# Patient Record
Sex: Male | Born: 1935
Health system: Southern US, Community
[De-identification: ages and names within clinical notes are randomized; demographics above are authoritative.]

## PROBLEM LIST (undated history)

## (undated) DIAGNOSIS — G309 Alzheimer's disease, unspecified: Secondary | ICD-10-CM

## (undated) DIAGNOSIS — T4145XA Adverse effect of unspecified anesthetic, initial encounter: Secondary | ICD-10-CM

## (undated) DIAGNOSIS — Z87442 Personal history of urinary calculi: Secondary | ICD-10-CM

## (undated) DIAGNOSIS — E039 Hypothyroidism, unspecified: Secondary | ICD-10-CM

## (undated) DIAGNOSIS — I4891 Unspecified atrial fibrillation: Secondary | ICD-10-CM

## (undated) DIAGNOSIS — M1990 Unspecified osteoarthritis, unspecified site: Secondary | ICD-10-CM

## (undated) DIAGNOSIS — E785 Hyperlipidemia, unspecified: Secondary | ICD-10-CM

## (undated) DIAGNOSIS — Z0389 Encounter for observation for other suspected diseases and conditions ruled out: Secondary | ICD-10-CM

## (undated) DIAGNOSIS — I10 Essential (primary) hypertension: Secondary | ICD-10-CM

## (undated) DIAGNOSIS — R351 Nocturia: Secondary | ICD-10-CM

## (undated) DIAGNOSIS — F0280 Dementia in other diseases classified elsewhere without behavioral disturbance: Secondary | ICD-10-CM

## (undated) DIAGNOSIS — J302 Other seasonal allergic rhinitis: Secondary | ICD-10-CM

## (undated) DIAGNOSIS — T8859XA Other complications of anesthesia, initial encounter: Secondary | ICD-10-CM

## (undated) DIAGNOSIS — R441 Visual hallucinations: Secondary | ICD-10-CM

## (undated) DIAGNOSIS — R39198 Other difficulties with micturition: Secondary | ICD-10-CM

## (undated) HISTORY — DX: Visual hallucinations: R44.1

## (undated) HISTORY — DX: Dementia in other diseases classified elsewhere without behavioral disturbance: F02.80

## (undated) HISTORY — PX: TONSILLECTOMY: SUR1361

## (undated) HISTORY — DX: Alzheimer's disease, unspecified: G30.9

## (undated) HISTORY — PX: CATARACT EXTRACTION: SUR2

---

## 1998-01-24 ENCOUNTER — Other Ambulatory Visit: Admission: RE | Admit: 1998-01-24 | Discharge: 1998-01-24 | Payer: Self-pay | Admitting: Family Medicine

## 1998-07-08 DIAGNOSIS — IMO0001 Reserved for inherently not codable concepts without codable children: Secondary | ICD-10-CM

## 1998-07-08 HISTORY — PX: CORONARY ANGIOGRAPHY: CATH118303

## 1998-07-08 HISTORY — DX: Reserved for inherently not codable concepts without codable children: IMO0001

## 1999-01-08 ENCOUNTER — Encounter: Payer: Self-pay | Admitting: Family Medicine

## 1999-01-09 ENCOUNTER — Encounter: Payer: Self-pay | Admitting: Diagnostic Radiology

## 1999-01-09 ENCOUNTER — Inpatient Hospital Stay (HOSPITAL_COMMUNITY): Admission: EM | Admit: 1999-01-09 | Discharge: 1999-01-12 | Payer: Self-pay | Admitting: Emergency Medicine

## 1999-01-11 ENCOUNTER — Encounter: Payer: Self-pay | Admitting: Urology

## 1999-01-11 ENCOUNTER — Encounter: Payer: Self-pay | Admitting: Cardiovascular Disease

## 1999-01-23 ENCOUNTER — Ambulatory Visit (HOSPITAL_COMMUNITY): Admission: RE | Admit: 1999-01-23 | Discharge: 1999-01-23 | Payer: Self-pay | Admitting: Cardiovascular Disease

## 2001-02-06 ENCOUNTER — Encounter: Payer: Self-pay | Admitting: Family Medicine

## 2001-02-06 ENCOUNTER — Encounter: Admission: RE | Admit: 2001-02-06 | Discharge: 2001-02-06 | Payer: Self-pay | Admitting: Family Medicine

## 2004-07-08 HISTORY — PX: CYSTOSCOPY: SUR368

## 2005-03-17 ENCOUNTER — Observation Stay (HOSPITAL_COMMUNITY): Admission: EM | Admit: 2005-03-17 | Discharge: 2005-03-18 | Payer: Self-pay | Admitting: Family Medicine

## 2006-12-19 ENCOUNTER — Encounter: Admission: RE | Admit: 2006-12-19 | Discharge: 2006-12-19 | Payer: Self-pay | Admitting: Family Medicine

## 2007-05-04 ENCOUNTER — Ambulatory Visit: Payer: Self-pay | Admitting: Gastroenterology

## 2007-05-14 ENCOUNTER — Encounter: Payer: Self-pay | Admitting: Gastroenterology

## 2007-05-14 ENCOUNTER — Ambulatory Visit: Payer: Self-pay | Admitting: Gastroenterology

## 2007-09-03 DIAGNOSIS — J309 Allergic rhinitis, unspecified: Secondary | ICD-10-CM | POA: Insufficient documentation

## 2007-09-03 DIAGNOSIS — I10 Essential (primary) hypertension: Secondary | ICD-10-CM

## 2007-09-03 DIAGNOSIS — E785 Hyperlipidemia, unspecified: Secondary | ICD-10-CM | POA: Insufficient documentation

## 2007-09-03 DIAGNOSIS — D126 Benign neoplasm of colon, unspecified: Secondary | ICD-10-CM

## 2007-09-03 DIAGNOSIS — K5730 Diverticulosis of large intestine without perforation or abscess without bleeding: Secondary | ICD-10-CM | POA: Insufficient documentation

## 2007-09-03 DIAGNOSIS — N200 Calculus of kidney: Secondary | ICD-10-CM | POA: Insufficient documentation

## 2007-09-03 DIAGNOSIS — M129 Arthropathy, unspecified: Secondary | ICD-10-CM | POA: Insufficient documentation

## 2010-11-20 NOTE — Assessment & Plan Note (Signed)
[facility]                         GASTROENTEROLOGY OFFICE NOTE   BLAKELEY, MARGRAF                      MRN:          [id number]  DATE:05/04/2007                            DOB:          1922-09-29    REASON FOR CONSULT:  Melena.   HISTORY OF PRESENT ILLNESS:  This is an 77 year old white male who  relates the onset of diarrhea in early September.  The diarrhea resolved  spontaneously and following that, he had 4 days of black and tarry  stools.  Since then, his symptoms have resolved.  He was evaluated by  Dr. Artis Flock and apparently home stool hemoccults were negative.  A recent  CBC, CMET, TSH  and urinalysis were unremarkable.  He relates no weight  loss, abdominal pain, rectal pain, change in bowel habits or change in  stool caliber except for the self-limited diarrhea that occurred  approximately 6 weeks ago.  There is no family history of colon cancer,  colon polyps or inflammatory bowel disease.  He states he had a  sigmoidoscopy performed about 10 years ago that was normal.   PAST MEDICAL HISTORY:  1. Hypertension.  2. Hyperlipidemia.  3. Arthritis.  4. Allergic rhinitis.  5. Kidney stones.   CURRENT MEDICATIONS:  1. Hyzaar 50/12.5 one daily.  2. Zocor 20 mg daily.  3. Allegra 180 mg daily.   MEDICATION ALLERGIES:  None known.   SOCIAL HISTORY AND REVIEW OF SYSTEMS:  Per the handwritten form.   PHYSICAL EXAMINATION:  Well-developed, well-nourished, no acute  distress.  Height 5 feet 8 inches, weight 197.4 pounds.  Blood pressure  is 142/82, pulse 82 and regular.  HEENT:  Anicteric sclerae, oropharynx clear.  CHEST:  Clear to auscultation bilaterally.  CARDIAC:  Regular rate and rhythm without murmurs appreciated.  ABDOMEN:  Soft, nontender, nondistended, normal active bowel sounds  without palpable organomegaly, masses or hernias.  RECTAL:  Deferred to time of colonoscopy.  Recent stool hemoccults  negative.  EXTREMITIES:   Without clubbing, cyanosis, or edema.  NEUROLOGIC:  Alert and oriented x3.  Grossly nonfocal.   ASSESSMENT AND PLAN:  History of melena.  Rule out colorectal neoplasms  and other sources of colonic blood loss.  Risks, benefits and  alternatives to colonoscopy with possible biopsy and possible  polypectomy discussed with the patient and he consents to proceed.     Venita Lick. Russella Dar, MD, [facility]  Electronically Signed    MTS/MedQ  DD: 05/11/2007  DT: 05/12/2007  Job #: [id number]   cc:   Quita Skye. Artis Flock, M.D.

## 2010-11-23 NOTE — Op Note (Signed)
[name] [name], [name] [name]               ACCOUNT NO.:  [id number]   MEDICAL RECORD NO.:  [id number]          PATIENT TYPE:  INP   LOCATION:  0103                         FACILITY:  [facility]   PHYSICIAN:  Excell Seltzer. Annabell Howells, M.D.    DATE OF BIRTH:  December 12, 1922   DATE OF PROCEDURE:  03/17/2005  DATE OF DISCHARGE:                                 OPERATIVE REPORT   PROCEDURES:  1.  Cystoscopy.  2.  Right retrograde pyelogram with interpretation.  3.  Right ureteroscopic stone extraction.  4.  Insertion of right double-J stent.   PREOPERATIVE DIAGNOSIS:  Right ureteral stone with fever.   POSTOPERATIVE DIAGNOSIS:  Right ureteral stone with fever.   SURGEON:  Excell Seltzer. Annabell Howells, M.D.   ANESTHESIA:  General.   DRAIN:  A 6-French x 24 cm double-J stent.   SPECIMENS:  None.   COMPLICATIONS:  None.   INDICATIONS:  [name] [name] is a 77 year old white male who has a 4-mm right  distal ureteral stone and is being observed but developed a fever over the  weekend 102. He was seen in the ER last night where his temperature was  101.1. His white count was mildly elevated at 13.8 and a CT scan revealed a  right distal ureteral stone with hydronephrosis and some perinephric  stranding. It was felt that the cysto and a stent with possible ureteroscopy  was indicated. He was given Cipro at 5:00 a.m. The procedure was done at 8  o'clock.   FINDINGS AND PROCEDURE:  The patient was taken to the operating room where  general anesthetic was induced as noted. He had been given Cipro  preoperatively. He was placed in the lithotomy position. His perineum and  genitalia were prepped with Betadine solution. He was draped in the usual  sterile fashion. Cystoscopy was performed in the usual fashion with a 22-  [place] scope. The 12 and 70 degrees lenses were used for evaluation.  Examination revealed a normal urethra. The external sphincter was intact.  The prostatic urethra was short with mild bilobar hyperplasia without  significant obstruction. Examination of the bladder revealed the bladder  wall had mild to moderate trabeculation without tumor, stones or  inflammation. The ureteral orifices were unremarkable. Of note, his  preoperative urine was free of nitrites and really only 1-3 white cells so  the urine in the bladder was clear.   A 5-French open-ended catheter was placed in the right ureteral orifice.  Contrast was instilled. This revealed some J hooking of the distal ureter.  There was a filling defect approximately 3 cm up consistent with a stone and  very mild dilation proximal to this.   A guidewire was then passed up the right ureteral orifice to the kidney and  since the patient had been on antibiotics, his urine was clear in the ER. I  elected to go ahead and attempt ureteroscopy with the intent that if it were  at all difficult, I would just put up a stent. A 12-French dilator sheath  was passed up to the level of the stone over the wire and a  6-French short  ureteroscope was then advanced alongside the wire. The stone was readily  visualized, grasped with a nitinol basket, and removed without difficulty.   The cystoscope was then re-inserted over the wire and a 6-French 24-cm  double-J stent was inserted under fluoroscopic guidance to the kidney. Upon  removal of the wire, there was a good coil noted in the kidney and in the  bladder. The bladder was drained. The scope was removed. The patient was  taken down from lithotomy position. His anesthetic was reversed. He was  moved to the recovery room in stable condition. There were no complications.      Excell Seltzer. Annabell Howells, M.D.  Electronically Signed     JJW/MEDQ  D:  03/17/2005  T:  03/18/2005  Job:  [id number]   cc:   Bertram Millard. Dahlstedt, M.D.  Fax: [phone]

## 2011-01-03 ENCOUNTER — Other Ambulatory Visit: Payer: Self-pay | Admitting: Dermatology

## 2011-04-23 ENCOUNTER — Ambulatory Visit (HOSPITAL_BASED_OUTPATIENT_CLINIC_OR_DEPARTMENT_OTHER)
Admission: RE | Admit: 2011-04-23 | Discharge: 2011-04-23 | Disposition: A | Discharge status: Home / Self Care | Payer: Medicare Other | Source: Ambulatory Visit | Attending: Orthopedic Surgery | Admitting: Orthopedic Surgery

## 2011-04-23 DIAGNOSIS — L608 Other nail disorders: Secondary | ICD-10-CM | POA: Insufficient documentation

## 2011-04-23 DIAGNOSIS — M19049 Primary osteoarthritis, unspecified hand: Secondary | ICD-10-CM | POA: Insufficient documentation

## 2011-04-23 DIAGNOSIS — M6740 Ganglion, unspecified site: Secondary | ICD-10-CM | POA: Insufficient documentation

## 2011-04-24 NOTE — Op Note (Signed)
[name], [name]               ACCOUNT NO.:  [id number]  MEDICAL RECORD NO.:  [id number]  LOCATION:                                 FACILITY:  PHYSICIAN:  Katy Fitch. Brandyce Dimario, M.D.      DATE OF BIRTH:  DATE OF PROCEDURE:  04/23/2011 DATE OF DISCHARGE:                              OPERATIVE REPORT   PREOPERATIVE DIAGNOSES: 1. Advanced osteoarthritis, left long finger distal interphalangeal     joint. 2. Mucoid cyst, dorsal radial nail fold. 3. Nail groove due to chronic pressure on germinal nail matrix.  POSTOPERATIVE DIAGNOSES: 1. Advanced osteoarthritis, left long finger distal interphalangeal     joint. 2. Mucoid cyst, dorsal radial nail fold. 3. Nail groove due to chronic pressure on germinal nail matrix.  OPERATION: 1. Resection of dorsal nail fold mucoid cyst. 2. Left long finger distal interphalangeal joint arthrotomy with     removal of marginal osteophytes, radial base of distal phalanx,     synovectomy, loose body removal, and irrigation of joint.  OPERATING SURGEON:  Katy Fitch. Harjas Biggins, MD  ASSISTANT:  Marveen Reeks. Dasnoit, PA-C.  ANESTHESIA:  Lidocaine 2%, metacarpal head level block of left long finger.  This was a minor operating room procedure.  INDICATIONS:  [name] [name] is a 77 year old gentleman referred through the courtesy of Dr. Guerry Bruin for evaluation and management of a mucoid cyst that has been chronic for more than 2 years.  He has a full length nail groove.  He has had multiple episodes of drainage. Fortunately, no episodes of septic arthritis.  His wife had a previous procedure performed more than 10 years ago and was very pleased with the long-term result.  Mr. Rohrman presented for evaluation of his finger.  We advised that we could prevent the deformity of his nail plate by decompressing the nail fold and with debridement of the joint, irrigation of the joint, synovectomy, and removal loose bodies, we had a good chance  of correcting the mucoid cyst.  With his degree of advanced osteoarthritis, we will not affect his pain from arthritis and cannot guarantee that he will not develop more mucoid cysts over time.  Fully understands these issues, he was brought to the operating room at this time.  PROCEDURE:  [name] [name] was brought to room #2 of the [facility] and placed in the supine position on the operating table. Following detailed informed consent, the left long finger was anesthetized after prep of the skin with alcohol, Betadine with 2% lidocaine into the region of the common digital nerves at metacarpal head level, 4.5 mL of 2% lidocaine was utilized.  The hand and arm were then prepped with Betadine soap solution and sterilely draped.  After a routine surgical time-out, the left long finger was exsanguinated with a gauze wrap and a half inch Penrose drain placed at the base of the finger as a digital tourniquet.  Procedure commenced with a curvilinear incision incorporating the cyst distally.  The cyst was circumferentially dissected, all mucin removed, the germinal nail matrix identified and decompressed.  The DIP joint was then opened proximally with a longitudinal incision, resection of the capsule between the radial collateral  ligament and extensor tendon.  A microcurette was used to remove the base osteophyte followed by synovectomy of the joint utilizing a micro rongeur and micro curette. The joint was thoroughly irrigated with sterile saline using a 19-gauge blunt dental needle with a 10 mL syringe repeatedly irrigating the joint until all loose bodies were debrided and removed.  Thereafter hemostasis was achieved.  The wound was repaired with trauma sutures of 5-0 nylon. Mr. Bovenzi was placed in a compressive dressing with Xeroflo sterile gauze and a finger Coban dressing.  He was encouraged to elevate his hand for 48 hours.  For aftercare, he is provided prescriptions  for Keflex 500 mg 1 p.o. q.8 hours x4 days as prophylactic antibiotic.  He is also provided Tylenol with Codeine #3, 1 p.o. q.4-6 hours p.r.n. pain, 20 tablets without refill.     Katy Fitch Idora Brosious, M.D.     RVS/MEDQ  D:  04/23/2011  T:  04/23/2011  Job:  [id number]  Electronically Signed by Josephine Igo M.D. on 04/24/2011 07:56:23 AM

## 2011-11-11 ENCOUNTER — Encounter (HOSPITAL_COMMUNITY): Payer: Self-pay | Admitting: Pharmacy Technician

## 2011-11-12 ENCOUNTER — Encounter (HOSPITAL_COMMUNITY): Payer: Self-pay

## 2011-11-12 ENCOUNTER — Encounter (HOSPITAL_COMMUNITY)
Admission: RE | Admit: 2011-11-12 | Discharge: 2011-11-12 | Disposition: A | Discharge status: Home / Self Care | Payer: Medicare Other | Source: Ambulatory Visit | Attending: Orthopedic Surgery | Admitting: Orthopedic Surgery

## 2011-11-12 HISTORY — DX: Other difficulties with micturition: R39.198

## 2011-11-12 HISTORY — DX: Hypothyroidism, unspecified: E03.9

## 2011-11-12 HISTORY — DX: Nocturia: R35.1

## 2011-11-12 HISTORY — DX: Unspecified osteoarthritis, unspecified site: M19.90

## 2011-11-12 HISTORY — DX: Hyperlipidemia, unspecified: E78.5

## 2011-11-12 HISTORY — DX: Essential (primary) hypertension: I10

## 2011-11-12 HISTORY — DX: Other complications of anesthesia, initial encounter: T88.59XA

## 2011-11-12 HISTORY — DX: Personal history of urinary calculi: Z87.442

## 2011-11-12 HISTORY — DX: Adverse effect of unspecified anesthetic, initial encounter: T41.45XA

## 2011-11-12 LAB — DIFFERENTIAL
Basophils Absolute: 0 10*3/uL (ref 0.0–0.1)
Basophils Relative: 0 % (ref 0–1)
Eosinophils Relative: 1 % (ref 0–5)
Monocytes Absolute: 0.6 10*3/uL (ref 0.1–1.0)
Monocytes Relative: 9 % (ref 3–12)
Neutro Abs: 4.3 10*3/uL (ref 1.7–7.7)

## 2011-11-12 LAB — URINALYSIS, ROUTINE W REFLEX MICROSCOPIC
Glucose, UA: NEGATIVE mg/dL
Hgb urine dipstick: NEGATIVE
Leukocytes, UA: NEGATIVE
Protein, ur: NEGATIVE mg/dL
pH: 5.5 (ref 5.0–8.0)

## 2011-11-12 LAB — BASIC METABOLIC PANEL
BUN: 19 mg/dL (ref 6–23)
CO2: 26 mEq/L (ref 19–32)
Chloride: 104 mEq/L (ref 96–112)
Creatinine, Ser: 1.04 mg/dL (ref 0.50–1.35)
GFR calc Af Amer: 73 mL/min — ABNORMAL LOW (ref >90)
Glucose, Bld: 113 mg/dL — ABNORMAL HIGH (ref 70–99)
Potassium: 3.6 mEq/L (ref 3.5–5.1)

## 2011-11-12 LAB — CBC
HCT: 39.3 % (ref 39.0–52.0)
Hemoglobin: 13.2 g/dL (ref 13.0–17.0)
MCH: 31.7 pg (ref 26.0–34.0)
MCHC: 33.6 g/dL (ref 30.0–36.0)
MCV: 94.5 fL (ref 78.0–100.0)
RDW: 13.3 % (ref 11.5–15.5)

## 2011-11-12 LAB — SURGICAL PCR SCREEN: MRSA, PCR: NEGATIVE

## 2011-11-12 MED ORDER — CHLORHEXIDINE GLUCONATE 4 % EX LIQD
60.0000 mL | Freq: Once | CUTANEOUS | Status: DC
  Filled 2011-11-12: qty 60

## 2011-11-12 NOTE — Patient Instructions (Signed)
20 [name] [name]  11/12/2011   Your procedure is scheduled on:  11/19/11  Report to SHORT STAY DEPT  at 5:15 AM.  Call this number if you have problems the morning of surgery: [phone]   Remember:   Do not eat food or drink liquids AFTER MIDNIGHT    Take these medicines the morning of surgery with A SIP OF WATER: SYNTHROID   Do not wear jewelry, make-up or nail polish.  Do not wear lotions, powders, or perfumes.   Do not shave legs or underarms 48 hrs. before surgery (men may shave face)  Do not bring valuables to the hospital.  Contacts, dentures or bridgework may not be worn into surgery.  Leave suitcase in the car. After surgery it may be brought to your room.  For patients admitted to the hospital, checkout time is 11:00 AM the day of discharge.   Patients discharged the day of surgery will not be allowed to drive home.    Special Instructions:   Please read over the following fact sheets that you were given: MRSA  Information               SHOWER WITH BETASEPT THE NIGHT BEFORE SURGERY AND THE MORNING OF SURGERY

## 2011-11-13 NOTE — Progress Notes (Signed)
H&P performed 11/13/11 Dictation # [id number]

## 2011-11-13 NOTE — H&P (Signed)
[name] [name], [name] [name]               ACCOUNT NO.:  [id number]  MEDICAL RECORD NO.:  [id number]  LOCATION:  PADM                         FACILITY:  [facility]  PHYSICIAN:  Jaquelyn Bitter. Cooper Moroney, P.A.DATE OF BIRTH:  Nov 19, 1922  DATE OF ADMISSION:  11/12/2011 DATE OF DISCHARGE:  11/12/2011                             HISTORY & PHYSICAL   DATE OF SURGERY:  Nov 19, 2011.  ADMITTING DIAGNOSIS:  End-stage osteoarthritis, left knee.  HISTORY OF PRESENT ILLNESS:  This is an 78 year old gentleman with a history of end-stage osteoarthritis of his left knee that has failed conservative management.  After discussion of treatment, benefits, risks, and options, the patient is now scheduled for total knee arthroplasty of the left knee.  Note that his medical doctor is Dr. Guerry Bruin, his cardiologist is Dr. Elease Hashimoto.  He is planning on going home after his surgery.  He is a candidate for tranexamic acid and dexamethasone and will receive both at surgery.  He is given his home medicines of iron, aspirin, Robaxin, MiraLAX and Colace.  PAST MEDICAL HISTORY:  Drug allergies, none.  Medical illnesses include hypothyroidism, hypertension, hyperlipidemia, allergies.  He also has some BPH and he has some retina problems and has been advised not to take Flomax or those type of medicines for urinary retention if possible.  CURRENT MEDICATIONS: 1. Levothyroxine 75 mcg 1 daily, 2. Losartan hydrochlorothiazide 50/12.51 daily, 3. Simvastatin 20 mg 1 daily, 4. Fexofenadine 180 mg daily.  PREVIOUS SURGERIES:  Tonsillectomy.  FAMILY HISTORY:  Positive for intestinal blockage, kidney failure and cancer.  SOCIAL HISTORY:  The patient is married.  He is retired.  He does not smoke and does not drink and again plans on going home after surgery.  REVIEW OF SYSTEMS:  CENTRAL NERVOUS SYSTEM:  Positive for cataracts and retina difficulties.  Also history of shingles and impaired hearing. PULMONARY:  Negative  shortness of breath, PND, orthopnea. CARDIOVASCULAR:  Positive for hypertension.  Negative for chest pain or palpitation.  GI:  Negative for ulcers, hepatitis.  GU:  Positive for history of kidney stones and BPH.  MUSCULOSKELETAL:  Positive as in HPI.  PHYSICAL EXAMINATION:  VITAL SIGNS:  Blood pressure 138/79, pulse 72 and regular, respirations 14. HEENT:  Head normocephalic.  Nose patent.  Ears patent.  Pupils equal, round, and reactive to light.  Throat without injection. NECK:  Supple without adenopathy.  Carotids 2+ without bruit. CHEST:  Clear to auscultation.  No rales or rhonchi.  Respirations 14. HEART:  Regular rate and rhythm at 72 beats per minute without murmur. ABDOMEN:  Soft with active bowel sounds.  No masses or organomegaly. NEUROLOGIC:  The patient is alert and oriented to time, place, and person.  Cranial nerves II-XII grossly intact. EXTREMITIES:  Show varus deformity of both knees.  Left knee shows 3 degrees from full extension with further flexion to 110 degrees. Crepitation throughout the range of motion.  Neurovascular status intact.  ASSESSMENT:  End-stage osteoarthritis, left knee.  PLAN:  Total knee arthroplasty, left knee.     Jaquelyn Bitter. Ernestene Kiel.     SJC/MEDQ  D:  11/13/2011  T:  11/13/2011  Job:  [id number]

## 2011-11-18 NOTE — Anesthesia Preprocedure Evaluation (Signed)
Anesthesia Evaluation  Patient identified by MRN, date of birth, ID band Patient awake    Reviewed: Allergy & Precautions, H&P , NPO status , Patient's Chart, lab work & pertinent test results  Airway Mallampati: II TM Distance: <3 FB Neck ROM: Full    Dental No notable dental hx.    Pulmonary neg pulmonary ROS,  breath sounds clear to auscultation  Pulmonary exam normal       Cardiovascular hypertension, Pt. on medications negative cardio ROS  Rhythm:Regular Rate:Normal     Neuro/Psych negative neurological ROS  negative psych ROS   GI/Hepatic negative GI ROS, Neg liver ROS,   Endo/Other  negative endocrine ROSHypothyroidism   Renal/GU negative Renal ROS  negative genitourinary   Musculoskeletal negative musculoskeletal ROS (+)   Abdominal   Peds negative pediatric ROS (+)  Hematology negative hematology ROS (+)   Anesthesia Other Findings   Reproductive/Obstetrics negative OB ROS                           Anesthesia Physical Anesthesia Plan  ASA: II  Anesthesia Plan: Spinal   Post-op Pain Management:    Induction:   Airway Management Planned: Simple Face Mask  Additional Equipment:   Intra-op Plan:   Post-operative Plan:   Informed Consent: I have reviewed the patients History and Physical, chart, labs and discussed the procedure including the risks, benefits and alternatives for the proposed anesthesia with the patient or authorized representative who has indicated his/her understanding and acceptance.   Dental advisory given  Plan Discussed with: CRNA  Anesthesia Plan Comments:         Anesthesia Quick Evaluation

## 2011-11-18 NOTE — Pre-Procedure Instructions (Signed)
PT NOTIFIED OF SURGERY TIME CHANGE TO 9:00AM AND ARRIVAL TIME TO BE 6:30 AM

## 2011-11-19 ENCOUNTER — Ambulatory Visit (HOSPITAL_COMMUNITY): Payer: Medicare Other | Admitting: Anesthesiology

## 2011-11-19 ENCOUNTER — Encounter (HOSPITAL_COMMUNITY)
Admission: RE | Disposition: A | Discharge status: Home Health | Payer: Self-pay | Source: Ambulatory Visit | Attending: Orthopedic Surgery

## 2011-11-19 ENCOUNTER — Encounter (HOSPITAL_COMMUNITY): Payer: Self-pay | Admitting: Anesthesiology

## 2011-11-19 ENCOUNTER — Encounter (HOSPITAL_COMMUNITY): Payer: Self-pay | Admitting: *Deleted

## 2011-11-19 ENCOUNTER — Inpatient Hospital Stay (HOSPITAL_COMMUNITY)
Admission: RE | Admit: 2011-11-19 | Discharge: 2011-11-21 | DRG: 470 | Disposition: A | Discharge status: Home Health | Payer: Medicare Other | Source: Ambulatory Visit | Attending: Orthopedic Surgery | Admitting: Orthopedic Surgery

## 2011-11-19 DIAGNOSIS — I10 Essential (primary) hypertension: Secondary | ICD-10-CM | POA: Diagnosis present

## 2011-11-19 DIAGNOSIS — N400 Enlarged prostate without lower urinary tract symptoms: Secondary | ICD-10-CM | POA: Diagnosis present

## 2011-11-19 DIAGNOSIS — M1710 Unilateral primary osteoarthritis, unspecified knee: Principal | ICD-10-CM | POA: Diagnosis present

## 2011-11-19 DIAGNOSIS — Z96659 Presence of unspecified artificial knee joint: Secondary | ICD-10-CM

## 2011-11-19 DIAGNOSIS — E785 Hyperlipidemia, unspecified: Secondary | ICD-10-CM | POA: Diagnosis present

## 2011-11-19 DIAGNOSIS — E039 Hypothyroidism, unspecified: Secondary | ICD-10-CM | POA: Diagnosis present

## 2011-11-19 HISTORY — PX: TOTAL KNEE ARTHROPLASTY: SHX125

## 2011-11-19 LAB — TYPE AND SCREEN: ABO/RH(D): A POS

## 2011-11-19 LAB — ABO/RH: ABO/RH(D): A POS

## 2011-11-19 SURGERY — ARTHROPLASTY, KNEE, TOTAL
Anesthesia: Spinal | Site: Knee | Laterality: Left | Wound class: Clean

## 2011-11-19 MED ORDER — LOSARTAN POTASSIUM 50 MG PO TABS
50.0000 mg | ORAL_TABLET | Freq: Every day | ORAL | Status: DC
  Administered 2011-11-20 – 2011-11-21 (×2): 50 mg via ORAL
  Filled 2011-11-19 (×4): qty 1

## 2011-11-19 MED ORDER — ONDANSETRON HCL 4 MG/2ML IJ SOLN
INTRAMUSCULAR | Status: DC | PRN
  Administered 2011-11-19 (×2): 2 mg via INTRAVENOUS

## 2011-11-19 MED ORDER — BISACODYL 5 MG PO TBEC
5.0000 mg | DELAYED_RELEASE_TABLET | Freq: Every day | ORAL | Status: DC | PRN
  Filled 2011-11-19: qty 1

## 2011-11-19 MED ORDER — CEFAZOLIN SODIUM-DEXTROSE 2-3 GM-% IV SOLR
INTRAVENOUS | Status: AC
  Filled 2011-11-19: qty 50

## 2011-11-19 MED ORDER — TRANEXAMIC ACID 100 MG/ML IV SOLN
1210.0000 mg | Freq: Once | INTRAVENOUS | Status: AC
  Administered 2011-11-19: 1210 mg via INTRAVENOUS
  Filled 2011-11-19: qty 12.1

## 2011-11-19 MED ORDER — 0.9 % SODIUM CHLORIDE (POUR BTL) OPTIME
TOPICAL | Status: DC | PRN
  Administered 2011-11-19: 1000 mL

## 2011-11-19 MED ORDER — CEFAZOLIN SODIUM-DEXTROSE 2-3 GM-% IV SOLR
2.0000 g | Freq: Once | INTRAVENOUS | Status: AC
  Administered 2011-11-19: 2 g via INTRAVENOUS

## 2011-11-19 MED ORDER — DEXAMETHASONE SODIUM PHOSPHATE 10 MG/ML IJ SOLN
10.0000 mg | Freq: Once | INTRAMUSCULAR | Status: AC
  Administered 2011-11-19: 10 mg via INTRAVENOUS

## 2011-11-19 MED ORDER — LORATADINE 10 MG PO TABS
10.0000 mg | ORAL_TABLET | Freq: Every day | ORAL | Status: DC
  Administered 2011-11-20 – 2011-11-21 (×2): 10 mg via ORAL
  Filled 2011-11-19 (×4): qty 1

## 2011-11-19 MED ORDER — EPHEDRINE SULFATE 50 MG/ML IJ SOLN
INTRAMUSCULAR | Status: DC | PRN
  Administered 2011-11-19: 10 mg via INTRAVENOUS
  Administered 2011-11-19 (×2): 5 mg via INTRAVENOUS

## 2011-11-19 MED ORDER — SIMVASTATIN 20 MG PO TABS
20.0000 mg | ORAL_TABLET | Freq: Every evening | ORAL | Status: DC
  Administered 2011-11-19 – 2011-11-20 (×2): 20 mg via ORAL
  Filled 2011-11-19 (×3): qty 1

## 2011-11-19 MED ORDER — ONDANSETRON HCL 4 MG PO TABS
4.0000 mg | ORAL_TABLET | Freq: Four times a day (QID) | ORAL | Status: DC | PRN
  Filled 2011-11-19: qty 1

## 2011-11-19 MED ORDER — METOCLOPRAMIDE HCL 5 MG/ML IJ SOLN: 5.0000 mg | Freq: Three times a day (TID) | INTRAMUSCULAR | Status: DC | PRN

## 2011-11-19 MED ORDER — HYDROCHLOROTHIAZIDE 12.5 MG PO CAPS
12.5000 mg | ORAL_CAPSULE | Freq: Every day | ORAL | Status: DC
  Administered 2011-11-20 – 2011-11-21 (×2): 12.5 mg via ORAL
  Filled 2011-11-19 (×4): qty 1

## 2011-11-19 MED ORDER — DEXAMETHASONE SODIUM PHOSPHATE 10 MG/ML IJ SOLN
10.0000 mg | Freq: Once | INTRAMUSCULAR | Status: AC
  Administered 2011-11-20: 10 mg via INTRAVENOUS
  Filled 2011-11-19: qty 1

## 2011-11-19 MED ORDER — METHOCARBAMOL 500 MG PO TABS
500.0000 mg | ORAL_TABLET | Freq: Four times a day (QID) | ORAL | Status: DC | PRN
  Administered 2011-11-20 – 2011-11-21 (×3): 500 mg via ORAL
  Filled 2011-11-19 (×3): qty 1

## 2011-11-19 MED ORDER — LEVOTHYROXINE SODIUM 75 MCG PO TABS
75.0000 ug | ORAL_TABLET | Freq: Every day | ORAL | Status: DC
  Administered 2011-11-20 – 2011-11-21 (×2): 75 ug via ORAL
  Filled 2011-11-19 (×4): qty 1

## 2011-11-19 MED ORDER — LOSARTAN POTASSIUM-HCTZ 50-12.5 MG PO TABS: 1.0000 | ORAL_TABLET | Freq: Every day | ORAL | Status: DC

## 2011-11-19 MED ORDER — POLYETHYLENE GLYCOL 3350 17 G PO PACK
17.0000 g | PACK | Freq: Two times a day (BID) | ORAL | Status: DC
  Administered 2011-11-19 – 2011-11-21 (×4): 17 g via ORAL
  Filled 2011-11-19 (×4): qty 1

## 2011-11-19 MED ORDER — ALUM & MAG HYDROXIDE-SIMETH 200-200-20 MG/5ML PO SUSP
30.0000 mL | ORAL | Status: DC | PRN
  Filled 2011-11-19: qty 30

## 2011-11-19 MED ORDER — PROPOFOL 10 MG/ML IV EMUL
INTRAVENOUS | Status: DC | PRN
  Administered 2011-11-19: 70 ug/kg/min via INTRAVENOUS

## 2011-11-19 MED ORDER — FENTANYL CITRATE 0.05 MG/ML IJ SOLN
INTRAMUSCULAR | Status: DC | PRN
  Administered 2011-11-19 (×2): 50 ug via INTRAVENOUS

## 2011-11-19 MED ORDER — LACTATED RINGERS IV SOLN
INTRAVENOUS | Status: DC | PRN
  Administered 2011-11-19: 09:00:00 via INTRAVENOUS

## 2011-11-19 MED ORDER — BUPIVACAINE-EPINEPHRINE PF 0.25-1:200000 % IJ SOLN
INTRAMUSCULAR | Status: AC
  Filled 2011-11-19: qty 60

## 2011-11-19 MED ORDER — BUPIVACAINE IN DEXTROSE 0.75-8.25 % IT SOLN
INTRATHECAL | Status: DC | PRN
  Administered 2011-11-19: 1.6 mL via INTRATHECAL

## 2011-11-19 MED ORDER — METOCLOPRAMIDE HCL 5 MG PO TABS
5.0000 mg | ORAL_TABLET | Freq: Three times a day (TID) | ORAL | Status: DC | PRN
  Filled 2011-11-19: qty 2

## 2011-11-19 MED ORDER — BUPIVACAINE-EPINEPHRINE PF 0.25-1:200000 % IJ SOLN
INTRAMUSCULAR | Status: DC | PRN
  Administered 2011-11-19: 60 mL

## 2011-11-19 MED ORDER — METHOCARBAMOL 100 MG/ML IJ SOLN
500.0000 mg | Freq: Four times a day (QID) | INTRAVENOUS | Status: DC | PRN
  Administered 2011-11-20: 500 mg via INTRAVENOUS
  Filled 2011-11-19 (×2): qty 5

## 2011-11-19 MED ORDER — FERROUS SULFATE 325 (65 FE) MG PO TABS
325.0000 mg | ORAL_TABLET | Freq: Three times a day (TID) | ORAL | Status: DC
  Administered 2011-11-19 – 2011-11-21 (×5): 325 mg via ORAL
  Filled 2011-11-19 (×8): qty 1

## 2011-11-19 MED ORDER — RIVAROXABAN 10 MG PO TABS
10.0000 mg | ORAL_TABLET | ORAL | Status: DC
  Administered 2011-11-20 – 2011-11-21 (×2): 10 mg via ORAL
  Filled 2011-11-19 (×3): qty 1

## 2011-11-19 MED ORDER — SODIUM CHLORIDE 0.9 % IR SOLN
Status: DC | PRN
  Administered 2011-11-19: 3000 mL

## 2011-11-19 MED ORDER — ONDANSETRON HCL 4 MG/2ML IJ SOLN: 4.0000 mg | Freq: Four times a day (QID) | INTRAMUSCULAR | Status: DC | PRN

## 2011-11-19 MED ORDER — KETOROLAC TROMETHAMINE 30 MG/ML IJ SOLN
INTRAMUSCULAR | Status: DC | PRN
  Administered 2011-11-19: 30 mg

## 2011-11-19 MED ORDER — HYDROCODONE-ACETAMINOPHEN 7.5-325 MG PO TABS
1.0000 | ORAL_TABLET | ORAL | Status: DC
  Administered 2011-11-19 – 2011-11-20 (×4): 1 via ORAL
  Administered 2011-11-20 – 2011-11-21 (×4): 2 via ORAL
  Filled 2011-11-19 (×2): qty 1
  Filled 2011-11-19: qty 2
  Filled 2011-11-19 (×5): qty 1
  Filled 2011-11-19: qty 2
  Filled 2011-11-19: qty 1

## 2011-11-19 MED ORDER — SODIUM CHLORIDE 0.9 % IV SOLN
INTRAVENOUS | Status: DC
  Administered 2011-11-19: 18:00:00 via INTRAVENOUS
  Filled 2011-11-19 (×6): qty 1000

## 2011-11-19 MED ORDER — KETOROLAC TROMETHAMINE 30 MG/ML IJ SOLN
INTRAMUSCULAR | Status: AC
  Filled 2011-11-19: qty 1

## 2011-11-19 MED ORDER — DOCUSATE SODIUM 100 MG PO CAPS
100.0000 mg | ORAL_CAPSULE | Freq: Two times a day (BID) | ORAL | Status: DC
  Administered 2011-11-19 – 2011-11-21 (×4): 100 mg via ORAL
  Filled 2011-11-19 (×4): qty 1

## 2011-11-19 MED ORDER — ZOLPIDEM TARTRATE 5 MG PO TABS: 5.0000 mg | ORAL_TABLET | Freq: Every evening | ORAL | Status: DC | PRN

## 2011-11-19 MED ORDER — FLEET ENEMA 7-19 GM/118ML RE ENEM
1.0000 | ENEMA | Freq: Once | RECTAL | Status: AC | PRN
  Filled 2011-11-19: qty 1

## 2011-11-19 MED ORDER — DIPHENHYDRAMINE HCL 25 MG PO CAPS
25.0000 mg | ORAL_CAPSULE | Freq: Four times a day (QID) | ORAL | Status: DC | PRN
  Filled 2011-11-19: qty 1

## 2011-11-19 MED ORDER — PHENOL 1.4 % MT LIQD: 1.0000 | OROMUCOSAL | Status: DC | PRN

## 2011-11-19 MED ORDER — CEFAZOLIN SODIUM-DEXTROSE 2-3 GM-% IV SOLR
2.0000 g | Freq: Four times a day (QID) | INTRAVENOUS | Status: AC
  Administered 2011-11-19 – 2011-11-20 (×3): 2 g via INTRAVENOUS
  Filled 2011-11-19 (×6): qty 50

## 2011-11-19 MED ORDER — MENTHOL 3 MG MT LOZG: 1.0000 | LOZENGE | OROMUCOSAL | Status: DC | PRN

## 2011-11-19 MED ORDER — HYDROMORPHONE HCL PF 1 MG/ML IJ SOLN: 0.5000 mg | INTRAMUSCULAR | Status: DC | PRN

## 2011-11-19 SURGICAL SUPPLY — 59 items
ADH SKN CLS APL DERMABOND .7 (GAUZE/BANDAGES/DRESSINGS) ×1
BAG SPEC THK2 15X12 ZIP CLS (MISCELLANEOUS) ×1
BAG ZIPLOCK 12X15 (MISCELLANEOUS) ×2 IMPLANT
BANDAGE ELASTIC 6 VELCRO ST LF (GAUZE/BANDAGES/DRESSINGS) ×2 IMPLANT
BANDAGE ESMARK 6X9 LF (GAUZE/BANDAGES/DRESSINGS) ×1 IMPLANT
BLADE SAW SGTL 13.0X1.19X90.0M (BLADE) ×2 IMPLANT
BNDG CMPR 9X6 STRL LF SNTH (GAUZE/BANDAGES/DRESSINGS) ×1
BNDG ESMARK 6X9 LF (GAUZE/BANDAGES/DRESSINGS) ×2
BOWL SMART MIX CTS (DISPOSABLE) ×2 IMPLANT
CEMENT HV SMART SET (Cement) ×2 IMPLANT
CLOTH BEACON ORANGE TIMEOUT ST (SAFETY) ×2 IMPLANT
CUFF TOURN SGL QUICK 34 (TOURNIQUET CUFF) ×2
CUFF TRNQT CYL 34X4X40X1 (TOURNIQUET CUFF) ×1 IMPLANT
DECANTER SPIKE VIAL GLASS SM (MISCELLANEOUS) ×1 IMPLANT
DERMABOND ADVANCED (GAUZE/BANDAGES/DRESSINGS) ×1
DERMABOND ADVANCED .7 DNX12 (GAUZE/BANDAGES/DRESSINGS) ×1 IMPLANT
DRAPE EXTREMITY T 121X128X90 (DRAPE) ×2 IMPLANT
DRAPE POUCH INSTRU U-SHP 10X18 (DRAPES) ×2 IMPLANT
DRAPE U-SHAPE 47X51 STRL (DRAPES) ×2 IMPLANT
DRSG AQUACEL AG ADV 3.5X10 (GAUZE/BANDAGES/DRESSINGS) ×2 IMPLANT
DRSG TEGADERM 4X4.75 (GAUZE/BANDAGES/DRESSINGS) ×2 IMPLANT
DURAPREP 26ML APPLICATOR (WOUND CARE) ×2 IMPLANT
ELECT REM PT RETURN 9FT ADLT (ELECTROSURGICAL) ×2
ELECTRODE REM PT RTRN 9FT ADLT (ELECTROSURGICAL) ×1 IMPLANT
EVACUATOR 1/8 PVC DRAIN (DRAIN) ×2 IMPLANT
FACESHIELD LNG OPTICON STERILE (SAFETY) ×10 IMPLANT
GAUZE SPONGE 2X2 8PLY STRL LF (GAUZE/BANDAGES/DRESSINGS) ×1 IMPLANT
GLOVE BIOGEL PI IND STRL 7.5 (GLOVE) ×1 IMPLANT
GLOVE BIOGEL PI IND STRL 8 (GLOVE) ×1 IMPLANT
GLOVE BIOGEL PI INDICATOR 7.5 (GLOVE) ×1
GLOVE BIOGEL PI INDICATOR 8 (GLOVE) ×1
GLOVE ECLIPSE 8.0 STRL XLNG CF (GLOVE) ×2 IMPLANT
GLOVE ORTHO TXT STRL SZ7.5 (GLOVE) ×4 IMPLANT
GOWN BRE IMP PREV XXLGXLNG (GOWN DISPOSABLE) ×4 IMPLANT
GOWN STRL NON-REIN LRG LVL3 (GOWN DISPOSABLE) ×2 IMPLANT
HANDPIECE INTERPULSE COAX TIP (DISPOSABLE) ×2
IMMOBILIZER KNEE 20 (SOFTGOODS) ×4
IMMOBILIZER KNEE 20 THIGH 36 (SOFTGOODS) IMPLANT
KIT BASIN OR (CUSTOM PROCEDURE TRAY) ×2 IMPLANT
MANIFOLD NEPTUNE II (INSTRUMENTS) ×2 IMPLANT
NDL SAFETY ECLIPSE 18X1.5 (NEEDLE) ×1 IMPLANT
NEEDLE HYPO 18GX1.5 SHARP (NEEDLE) ×2
NS IRRIG 1000ML POUR BTL (IV SOLUTION) ×4 IMPLANT
PACK TOTAL JOINT (CUSTOM PROCEDURE TRAY) ×2 IMPLANT
POSITIONER SURGICAL ARM (MISCELLANEOUS) ×2 IMPLANT
SET HNDPC FAN SPRY TIP SCT (DISPOSABLE) ×1 IMPLANT
SET PAD KNEE POSITIONER (MISCELLANEOUS) ×2 IMPLANT
SPONGE GAUZE 2X2 STER 10/PKG (GAUZE/BANDAGES/DRESSINGS) ×1
SUCTION FRAZIER 12FR DISP (SUCTIONS) ×2 IMPLANT
SUT MNCRL AB 4-0 PS2 18 (SUTURE) ×2 IMPLANT
SUT VIC AB 1 CT1 36 (SUTURE) ×6 IMPLANT
SUT VIC AB 2-0 CT1 27 (SUTURE) ×6
SUT VIC AB 2-0 CT1 TAPERPNT 27 (SUTURE) ×3 IMPLANT
SUT VLOC 180 0 24IN GS25 (SUTURE) ×1 IMPLANT
SYR 50ML LL SCALE MARK (SYRINGE) ×2 IMPLANT
TOWEL OR 17X26 10 PK STRL BLUE (TOWEL DISPOSABLE) ×4 IMPLANT
TRAY FOLEY CATH 14FRSI W/METER (CATHETERS) ×2 IMPLANT
WATER STERILE IRR 1500ML POUR (IV SOLUTION) ×2 IMPLANT
WRAP KNEE MAXI GEL POST OP (GAUZE/BANDAGES/DRESSINGS) ×2 IMPLANT

## 2011-11-19 NOTE — H&P (View-Only) (Signed)
NAME:  [name], [name]               ACCOUNT NO.:  [id number]  MEDICAL RECORD NO.:  [id number]  LOCATION:  PADM                         FACILITY:  [facility]  PHYSICIAN:  Joffre Lucks J. Mariene Dickerman, P.A.DATE OF BIRTH:  10/06/1922  DATE OF ADMISSION:  11/12/2011 DATE OF DISCHARGE:  11/12/2011                             HISTORY & PHYSICAL   DATE OF SURGERY:  Nov 19, 2011.  ADMITTING DIAGNOSIS:  End-stage osteoarthritis, left knee.  HISTORY OF PRESENT ILLNESS:  This is an 78-year-old gentleman with a history of end-stage osteoarthritis of his left knee that has failed conservative management.  After discussion of treatment, benefits, risks, and options, the patient is now scheduled for total knee arthroplasty of the left knee.  Note that his medical doctor is Dr. Richard Tisovec, his cardiologist is Dr. Nahser.  He is planning on going home after his surgery.  He is a candidate for tranexamic acid and dexamethasone and will receive both at surgery.  He is given his home medicines of iron, aspirin, Robaxin, MiraLAX and Colace.  PAST MEDICAL HISTORY:  Drug allergies, none.  Medical illnesses include hypothyroidism, hypertension, hyperlipidemia, allergies.  He also has some BPH and he has some retina problems and has been advised not to take Flomax or those type of medicines for urinary retention if possible.  CURRENT MEDICATIONS: 1. Levothyroxine 75 mcg 1 daily, 2. Losartan hydrochlorothiazide 50/12.51 daily, 3. Simvastatin 20 mg 1 daily, 4. Fexofenadine 180 mg daily.  PREVIOUS SURGERIES:  Tonsillectomy.  FAMILY HISTORY:  Positive for intestinal blockage, kidney failure and cancer.  SOCIAL HISTORY:  The patient is married.  He is retired.  He does not smoke and does not drink and again plans on going home after surgery.  REVIEW OF SYSTEMS:  CENTRAL NERVOUS SYSTEM:  Positive for cataracts and retina difficulties.  Also history of shingles and impaired hearing. PULMONARY:  Negative  shortness of breath, PND, orthopnea. CARDIOVASCULAR:  Positive for hypertension.  Negative for chest pain or palpitation.  GI:  Negative for ulcers, hepatitis.  GU:  Positive for history of kidney stones and BPH.  MUSCULOSKELETAL:  Positive as in HPI.  PHYSICAL EXAMINATION:  VITAL SIGNS:  Blood pressure 138/79, pulse 72 and regular, respirations 14. HEENT:  Head normocephalic.  Nose patent.  Ears patent.  Pupils equal, round, and reactive to light.  Throat without injection. NECK:  Supple without adenopathy.  Carotids 2+ without bruit. CHEST:  Clear to auscultation.  No rales or rhonchi.  Respirations 14. HEART:  Regular rate and rhythm at 72 beats per minute without murmur. ABDOMEN:  Soft with active bowel sounds.  No masses or organomegaly. NEUROLOGIC:  The patient is alert and oriented to time, place, and person.  Cranial nerves II-XII grossly intact. EXTREMITIES:  Show varus deformity of both knees.  Left knee shows 3 degrees from full extension with further flexion to 110 degrees. Crepitation throughout the range of motion.  Neurovascular status intact.  ASSESSMENT:  End-stage osteoarthritis, left knee.  PLAN:  Total knee arthroplasty, left knee.     Dajanique Robley J. Sully Dyment, P.A.     SJC/MEDQ  D:  11/13/2011  T:  11/13/2011  Job:  [id number] 

## 2011-11-19 NOTE — Op Note (Signed)
NAME:  [name] [name]                      MEDICAL RECORD NO.:  [id number]                             FACILITY:  [facility]      PHYSICIAN:  Madlyn Frankel. Charlann Boxer, M.D.  DATE OF BIRTH:  Jul 06, 1923      DATE OF PROCEDURE:  11/19/2011                                     OPERATIVE REPORT         PREOPERATIVE DIAGNOSIS:  Left knee osteoarthritis.      POSTOPERATIVE DIAGNOSIS:  Left knee osteoarthritis.      FINDINGS:  The patient was noted to have complete loss of cartilage and   bone-on-bone arthritis with associated osteophytes in the medial and patellofemoral compartments of   the knee with a significant synovitis and associated effusion.      PROCEDURE:  Left total knee replacement.      COMPONENTS USED:  DePuy rotating platform posterior stabilized knee   system, a size 4 femur, 5 tibia, 12.5 mm insert, and 41 patellar   button.      SURGEON:  Madlyn Frankel. Charlann Boxer, M.D.      ASSISTANT:  Lanney Gins, PA-C.      ANESTHESIA:  Spinal.      SPECIMENS:  None.      COMPLICATION:  None.      DRAINS:  One Hemovac.  EBL: <100cc      TOURNIQUET TIME:   Total Tourniquet Time Documented: Thigh (Left) - 35 minutes .      The patient was stable to the recovery room.      INDICATION FOR PROCEDURE:  [name] [name] is a 78 y.o. male patient of   mine.  The patient had been seen, evaluated, and treated conservatively in the   office with medication, activity modification, and injections.  The patient had   radiographic changes of bone-on-bone arthritis with endplate sclerosis and osteophytes noted.      The patient failed conservative measures including medication, injections, and activity modification, and at this point was ready for more definitive measures.   Based on the radiographic changes and failed conservative measures, the patient   decided to proceed with total knee replacement.  Risks of infection,   DVT, component failure, need for revision surgery, postop course, and   expectations were all   discussed and reviewed.  Consent was obtained for benefit of pain   relief.      PROCEDURE IN DETAIL:  The patient was brought to the operative theater.   Once adequate anesthesia, preoperative antibiotics, 2 gm of Ancef administered, the patient was positioned supine with the left thigh tourniquet placed.  The  left lower extremity was prepped and draped in sterile fashion.  A time-   out was performed identifying the patient, planned procedure, and   extremity.      The left lower extremity was placed in the [facility] leg holder.  The leg was   exsanguinated, tourniquet elevated to 250 mmHg.  A midline incision was   made followed by median parapatellar arthrotomy.  Following initial   exposure, attention was first directed to the patella.  Precut  measurement was noted to be 26 mm.  I resected down to 15 mm and used a   41 patellar button to restore patellar height as well as cover the cut   surface.      The lug holes were drilled and a metal shim was placed to protect the   patella from retractors and saw blades.      At this point, attention was now directed to the femur.  The femoral   canal was opened with a drill, irrigated to try to prevent fat emboli.  An   intramedullary rod was passed at 5 degrees valgus, 10 mm of bone was   resected off the distal femur.  Following this resection, the tibia was   subluxated anteriorly.  Using the extramedullary guide, 10 mm of bone was resected off   the proximal lateral tibia. We confirmed the gap would be   stable medially and laterally with a 10 mm insert as well as confirmed   the cut was perpendicular in the coronal plane, checking with an alignment rod.      Once this was done, I sized the femur to be a size 4in the anterior-   posterior dimension, chose a standard component based on medial and   lateral dimension.  The size 4 rotation block was then pinned in   position anterior referenced using the C-clamp  to set rotation.  The   anterior, posterior, and  chamfer cuts were made without difficulty nor   notching making certain that I was along the anterior cortex to help   with flexion gap stability.      The final box cut was made off the lateral aspect of distal femur.      At this point, the tibia was sized to be a size 5, the size 5 tray was   then pinned in position through the medial third of the tubercle,   drilled, and keel punched.  Trial reduction was now carried with a 4 standard femur,  5 tibia, a 12.5 mm insert, and the 41 patella botton.  The knee was brought to   extension, full extension with good flexion stability with the patella   tracking through the trochlea without application of pressure.  Given   all these findings, the trial components removed.  Final components were   opened and cement was mixed.  The knee was irrigated with normal saline   solution and pulse lavage.  The synovial lining was   then injected with 0.25% Marcaine with epinephrine and 1 cc of Toradol,   total of 61 cc.      The knee was irrigated.  Final implants were then cemented onto clean and   dried cut surfaces of bone with the knee brought to extension with a 12.5   mm trial insert.      Once the cement had fully cured, the excess cement was removed   throughout the knee.  I confirmed I was satisfied with the range of   motion and stability, and the final 12.5 mm insert was chosen.  It was   placed into the knee.      The tourniquet had been let down at 35 minutes.  No significant   hemostasis required.  The medium Hemovac drain was placed deep.  The   extensor mechanism was then reapproximated using #1 Vicryl with the knee   in flexion.  The   remaining wound was closed with 2-0 Vicryl and  running 4-0 Monocryl.   The knee was cleaned, dried, dressed sterilely using Dermabond and   Aquacel dressing.  Drain site dressed separately.  The patient was then   brought to recovery room in stable  condition, tolerating the procedure   well.   Please note that Physician Assistant, Lanney Gins, PA-C, was present for the entirety of the case, and was utilized for pre-operative positioning, peri-operative retractor management, general facilitation of the procedure.  He was also utilized for primary wound closure at the end of the case.              Madlyn Frankel Charlann Boxer, M.D.

## 2011-11-19 NOTE — Anesthesia Procedure Notes (Signed)
Spinal  Patient location during procedure: OR Start time: 11/19/2011 9:03 AM End time: 11/19/2011 9:13 AM Staffing Performed by: anesthesiologist  Preanesthetic Checklist Completed: patient identified, site marked, surgical consent, pre-op evaluation, timeout performed, IV checked, risks and benefits discussed and monitors and equipment checked Spinal Block Patient position: sitting Prep: Betadine Patient monitoring: heart rate, continuous pulse ox and blood pressure Injection technique: single-shot Needle Needle type: Spinocan  Needle gauge: 22 G Needle length: 9 cm Additional Notes Expiration date of kit checked and confirmed. Patient tolerated procedure well, without complications.

## 2011-11-19 NOTE — Interval H&P Note (Signed)
History and Physical Interval Note:  11/19/2011 6:50 AM  [name] [name]  has presented today for surgery, with the diagnosis of Osteoarthritis of the Left Knee  The various methods of treatment have been discussed with the patient and family. After consideration of risks, benefits and other options for treatment, the patient has consented to  Procedure(s) (LRB): LEFT TOTAL KNEE ARTHROPLASTY (Left) as a surgical intervention .  The patients' history has been reviewed, patient examined, no change in status, stable for surgery.  I have reviewed the patients' chart and labs.  Questions were answered to the patient's satisfaction.     [name]

## 2011-11-19 NOTE — Anesthesia Postprocedure Evaluation (Signed)
  Anesthesia Post-op Note  Patient: [name] [name]  Procedure(s) Performed: Procedure(s) (LRB): TOTAL KNEE ARTHROPLASTY (Left)  Patient Location: PACU  Anesthesia Type: Spinal  Level of Consciousness: awake and alert   Airway and Oxygen Therapy: Patient Spontanous Breathing  Post-op Pain: mild  Post-op Assessment: Post-op Vital signs reviewed, Patient's Cardiovascular Status Stable, Respiratory Function Stable, Patent Airway and No signs of Nausea or vomiting  Post-op Vital Signs: stable  Complications: No apparent anesthesia complications

## 2011-11-19 NOTE — Transfer of Care (Signed)
Immediate Anesthesia Transfer of Care Note  Patient: [name] [name]  Procedure(s) Performed: Procedure(s) (LRB): TOTAL KNEE ARTHROPLASTY (Left)  Patient Location: PACU  Anesthesia Type: Regional  Level of Consciousness: awake and alert   Airway & Oxygen Therapy: Patient Spontanous Breathing and Patient connected to face mask oxygen  Post-op Assessment: Report given to PACU RN and Post -op Vital signs reviewed and stable  Post vital signs: Reviewed and stable  Complications: No apparent anesthesia complications

## 2011-11-20 ENCOUNTER — Encounter (HOSPITAL_COMMUNITY): Payer: Self-pay | Admitting: Orthopedic Surgery

## 2011-11-20 LAB — CBC
HCT: 30.6 % — ABNORMAL LOW (ref 39.0–52.0)
Hemoglobin: 10.5 g/dL — ABNORMAL LOW (ref 13.0–17.0)
MCHC: 34.3 g/dL (ref 30.0–36.0)
MCV: 93.3 fL (ref 78.0–100.0)
WBC: 11.7 10*3/uL — ABNORMAL HIGH (ref 4.0–10.5)

## 2011-11-20 LAB — BASIC METABOLIC PANEL
BUN: 20 mg/dL (ref 6–23)
Chloride: 103 mEq/L (ref 96–112)
Creatinine, Ser: 1 mg/dL (ref 0.50–1.35)
Glucose, Bld: 145 mg/dL — ABNORMAL HIGH (ref 70–99)
Potassium: 4.2 mEq/L (ref 3.5–5.1)

## 2011-11-20 MED ORDER — HYDROCODONE-ACETAMINOPHEN 7.5-325 MG PO TABS: 1.0000 | ORAL_TABLET | ORAL | Status: AC

## 2011-11-20 MED ORDER — DIPHENHYDRAMINE HCL 25 MG PO CAPS: 25.0000 mg | ORAL_CAPSULE | Freq: Four times a day (QID) | ORAL | Status: DC | PRN

## 2011-11-20 MED ORDER — METHOCARBAMOL 500 MG PO TABS: 500.0000 mg | ORAL_TABLET | Freq: Four times a day (QID) | ORAL | Status: AC | PRN

## 2011-11-20 MED ORDER — ASPIRIN EC 325 MG PO TBEC: 325.0000 mg | DELAYED_RELEASE_TABLET | Freq: Two times a day (BID) | ORAL | Status: AC

## 2011-11-20 MED ORDER — DSS 100 MG PO CAPS: 100.0000 mg | ORAL_CAPSULE | Freq: Two times a day (BID) | ORAL | Status: AC

## 2011-11-20 MED ORDER — FERROUS SULFATE 325 (65 FE) MG PO TABS: 325.0000 mg | ORAL_TABLET | Freq: Three times a day (TID) | ORAL | Status: DC

## 2011-11-20 MED ORDER — POLYETHYLENE GLYCOL 3350 17 G PO PACK: 17.0000 g | PACK | Freq: Two times a day (BID) | ORAL | Status: AC

## 2011-11-20 NOTE — Evaluation (Signed)
Physical Therapy Evaluation Patient Details Name: [name] [name] MRN: [id number] DOB: June 21, 1923 Today's Date: 11/20/2011 Time: 5621-3086 PT Time Calculation (min): 23 min  PT Assessment / Plan / Recommendation Clinical Impression  Pt s/p L TKA.  Pt would benefit from acute PT services in order to improve transfers, ambulation, and stairs by increasing strength and ROM of L LE to prepare for d/c home with spouse.   Pt would like to eventually return to yardwork and spouse reports he is usually very physically active.    PT Assessment  Patient needs continued PT services    Follow Up Recommendations  Home health PT    Barriers to Discharge        lEquipment Recommendations  Rolling walker with 5" wheels;3 in 1 bedside comode    Recommendations for Other Services     Frequency 7X/week    Precautions / Restrictions Precautions Precautions: Knee Required Braces or Orthoses: Knee Immobilizer - Left Knee Immobilizer - Left: Discontinue once straight leg raise with < 10 degree lag Restrictions Weight Bearing Restrictions: No LLE Weight Bearing: Weight bearing as tolerated   Pertinent Vitals/Pain       Mobility  Bed Mobility Bed Mobility: Supine to Sit Supine to Sit: 4: Min assist Details for Bed Mobility Assistance: assist for L LE, verbal cues for technique Transfers Transfers: Stand to Sit;Sit to Stand Sit to Stand: 4: Min guard;From bed;With upper extremity assist Stand to Sit: 4: Min guard;To chair/3-in-1;With upper extremity assist Details for Transfer Assistance: verbal cues for L LE forward and hand placement Ambulation/Gait Ambulation/Gait Assistance: 4: Min guard Ambulation Distance (Feet): 100 Feet Assistive device: Rolling walker Ambulation/Gait Assistance Details: verbal cues for sequence, step length, and safe RW distance especially with turns Gait Pattern: Step-to pattern;Decreased stance time - left Gait velocity: decreased    Exercises Total Joint  Exercises Ankle Circles/Pumps: AROM;Both;20 reps;Supine Quad Sets: AROM;Both;20 reps;Supine Short Arc Quad: AROM;Strengthening;Left;20 reps;Supine Heel Slides: AAROM;Left;20 reps;Supine Hip ABduction/ADduction: AROM;Strengthening;Left;Supine;10 reps Straight Leg Raises: AAROM;Strengthening;Left;10 reps;Supine   PT Diagnosis: Difficulty walking  PT Problem List: Decreased strength;Decreased range of motion;Decreased mobility;Decreased knowledge of use of DME;Decreased knowledge of precautions PT Treatment Interventions: DME instruction;Gait training;Stair training;Functional mobility training;Therapeutic activities;Therapeutic exercise;Patient/family education   PT Goals Acute Rehab PT Goals PT Goal Formulation: With patient Time For Goal Achievement: 11/27/11 Potential to Achieve Goals: Good Pt will go Supine/Side to Sit: with supervision PT Goal: Supine/Side to Sit - Progress: Goal set today Pt will go Sit to Supine/Side: with supervision PT Goal: Sit to Supine/Side - Progress: Goal set today Pt will go Sit to Stand: with supervision PT Goal: Sit to Stand - Progress: Goal set today Pt will go Stand to Sit: with supervision PT Goal: Stand to Sit - Progress: Goal set today Pt will Ambulate: >150 feet;with supervision;with least restrictive assistive device PT Goal: Ambulate - Progress: Goal set today Pt will Go Up / Down Stairs: 1-2 stairs;with supervision;with rolling walker PT Goal: Up/Down Stairs - Progress: Goal set today Pt will Perform Home Exercise Program: with supervision, verbal cues required/provided PT Goal: Perform Home Exercise Program - Progress: Goal set today  Visit Information  Last PT Received On: 11/20/11 Assistance Needed: +1    Subjective Data  Subjective: "How am I doing?"  Patient Stated Goal: pt would like to return to yardwork   Prior Functioning  Home Living Lives With: Spouse Type of Home: House Home Access: Stairs to enter [employer] of Steps: 2 wide steps Home Layout: One  level Prior Function Level of Independence: Independent Communication Communication: HOH    Cognition  Overall Cognitive Status: Appears within functional limits for tasks assessed/performed Arousal/Alertness: Awake/alert Orientation Level: Appears intact for tasks assessed Behavior During Session: [facility] for tasks performed    Extremity/Trunk Assessment Right Upper Extremity Assessment RUE ROM/Strength/Tone: [facility] for tasks assessed Left Upper Extremity Assessment LUE ROM/Strength/Tone: WFL for tasks assessed Right Lower Extremity Assessment RLE ROM/Strength/Tone: [facility] for tasks assessed Left Lower Extremity Assessment LLE ROM/Strength/Tone: Deficits LLE ROM/Strength/Tone Deficits: good quad contraction, L knee AAROM 0-90*   Balance    End of Session PT - End of Session Equipment Utilized During Treatment: Gait belt;Left knee immobilizer Activity Tolerance: Patient tolerated treatment well Patient left: in chair;with call bell/phone within reach;with family/visitor present   [name] [name],[name] [name] 11/20/2011, 10:15 AM Pager: [phone]

## 2011-11-20 NOTE — Evaluation (Signed)
Occupational Therapy Evaluation Patient Details Name: [name] [name] MRN: [id number] DOB: 02-Nov-1922 Today's Date: 11/20/2011 Time: 4098-1191 OT Time Calculation (min): 23 min  OT Assessment / Plan / Recommendation Clinical Impression  This 78 year old man was admitted for L TKA.  All education was completed, and pt has no further OT needs.    OT Assessment  Patient does not need any further OT services    Follow Up Recommendations  No OT follow up    Barriers to Discharge      Equipment Recommendations  3 in 1 bedside comode;Rolling walker with 5" wheels (pt/wife would like pricing info about tub bench; they may be able to borrow this.  Pt plans second TKA after this )    Recommendations for Other Services    Frequency       Precautions / Restrictions Precautions Precautions: Knee Required Braces or Orthoses: Knee Immobilizer - Left Knee Immobilizer - Left: Discontinue once straight leg raise with < 10 degree lag Restrictions LLE Weight Bearing: Weight bearing as tolerated   Pertinent Vitals/Pain     ADL  Eating/Feeding: Simulated;Independent Where Assessed - Eating/Feeding: Chair Grooming: Simulated;Supervision/safety Where Assessed - Grooming: Standing at sink Upper Body Bathing: Simulated;Set up Where Assessed - Upper Body Bathing: Sitting, chair;Unsupported Lower Body Bathing: Simulated;Minimal assistance Where Assessed - Lower Body Bathing: Sit to stand from chair Upper Body Dressing: Simulated;Set up Where Assessed - Upper Body Dressing: Sitting, chair;Unsupported Lower Body Dressing: Simulated;Moderate assistance Where Assessed - Lower Body Dressing: Sit to stand from chair Toilet Transfer: Simulated;Minimal assistance Toilet Transfer Method: Stand pivot Toilet Transfer Equipment:  Nurse, children's) Toileting - Clothing Manipulation: Simulated;Minimal assistance (min guard) Where Assessed - Toileting Clothing Manipulation: Standing Toileting - Hygiene:  Simulated;Minimal assistance (min guard) Where Assessed - Toileting Hygiene: Standing Tub/Shower Transfer: Other (comment) (demonstrated tub bench) Equipment Used: [employer] ADL Comments: pt/wife interested in tub bench--may be able to borrow.  Pt plans second TKA in 6-8 weeks    OT Diagnosis:    OT Problem List:   OT Treatment Interventions:     OT Goals    Visit Information  Last OT Received On: 11/20/11 Assistance Needed: +1    Subjective Data  Subjective: "I still have a funny feeling, like a little weak" Patient Stated Goal: get back to yardwork.  Plans to have 2nd knee done in 6-8 weeks   Prior Functioning  Home Living Bathroom Shower/Tub: Tub/shower unit Bathroom Toilet: Standard Prior Function Level of Independence: Independent Communication Communication: HOH Dominant Hand: Right    Cognition  Overall Cognitive Status: Appears within functional limits for tasks assessed/performed Arousal/Alertness: Awake/alert Orientation Level: Appears intact for tasks assessed Behavior During Session: [facility] for tasks performed    Extremity/Trunk Assessment Right Upper Extremity Assessment RUE ROM/Strength/Tone: Within functional levels Left Upper Extremity Assessment LUE ROM/Strength/Tone: Within functional levels   Mobility Transfers Sit to Stand: 4: Min guard;With upper extremity assist;From chair/3-in-1 Details for Transfer Assistance: 1 vc for hand placement   Exercise    Balance    End of Session OT - End of Session Activity Tolerance: Patient tolerated treatment well Patient left: in chair;with call bell/phone within reach;with family/visitor present   Anaija Wissink 11/20/2011, 3:04 PM Marica Otter, OTR/L [phone] 11/20/2011

## 2011-11-20 NOTE — Progress Notes (Signed)
Physical Therapy Treatment Patient Details Name: [name] [name] MRN: [id number] DOB: 07/26/1922 Today's Date: 11/20/2011 Time: 1350-1403 PT Time Calculation (min): 13 min  PT Assessment / Plan / Recommendation Comments on Treatment Session  Pt ambulated again this afternoon and doing well.  Pt requested to be left on toilet to attempt BM (wife in room and instructed to pull cord when finished or if feeling dizzy).  Will need to practice step up prior to d/c.    Follow Up Recommendations  Home health PT    Barriers to Discharge        Equipment Recommendations  3 in 1 bedside comode;Rolling walker with 5" wheels    Recommendations for Other Services    Frequency     Plan Discharge plan remains appropriate;Frequency remains appropriate    Precautions / Restrictions Precautions Precautions: Knee Required Braces or Orthoses: Knee Immobilizer - Left Knee Immobilizer - Left: Discontinue once straight leg raise with < 10 degree lag Restrictions LLE Weight Bearing: Weight bearing as tolerated   Pertinent Vitals/Pain     Mobility  Transfers Transfers: Stand to Sit;Sit to Stand Sit to Stand: With upper extremity assist;From chair/3-in-1;4: Min assist Stand to Sit: 4: Min guard;With upper extremity assist;To toilet Details for Transfer Assistance: pt able to recall safe technique, assist to steady upon rising Ambulation/Gait Ambulation/Gait Assistance: 4: Min guard Ambulation Distance (Feet): 160 Feet Assistive device: Rolling walker Ambulation/Gait Assistance Details: initial verbal cue for sequence Gait Pattern: Step-to pattern;Decreased stance time - left Gait velocity: decreased    Exercises     PT Diagnosis:    PT Problem List:   PT Treatment Interventions:     PT Goals Acute Rehab PT Goals PT Goal: Sit to Stand - Progress: Progressing toward goal PT Goal: Stand to Sit - Progress: Progressing toward goal PT Goal: Ambulate - Progress: Progressing toward goal  Visit  Information  Last PT Received On: 11/20/11 Assistance Needed: +1    Subjective Data  Subjective: "I'd like to sit on the toilet a while."     Cognition  Overall Cognitive Status: Appears within functional limits for tasks assessed/performed Arousal/Alertness: Awake/alert Orientation Level: Appears intact for tasks assessed Behavior During Session: [facility] for tasks performed    Balance     End of Session PT - End of Session Equipment Utilized During Treatment: Left knee immobilizer Activity Tolerance: Patient tolerated treatment well Patient left: Other (comment);with call bell/phone within reach;with family/visitor present (pt on toilet with spouse in room and instructed to pull cord) Nurse Communication: Other (comment) (nsg tech aware pt on toilet)    Amen Dargis,[name] 11/20/2011, 3:21 PM Pager: [phone]

## 2011-11-20 NOTE — Progress Notes (Signed)
Subjective: 1 Day Post-Op Procedure(s) (LRB): TOTAL KNEE ARTHROPLASTY (Left)   Patient reports pain as mild. Pain well controlled. No events throughout the night.  Objective:   VITALS:   Filed Vitals:   11/20/11 0626  BP: 108/67  Pulse: 74  Temp: 98 F (36.7 C)  Resp: 16    Neurovascular intact Dorsiflexion/Plantar flexion intact Incision: dressing C/D/I No cellulitis present Compartment soft  LABS  Basename 11/20/11 0415  HGB 10.5*  HCT 30.6*  WBC 11.7*  PLT 122*     Basename 11/20/11 0415  NA 136  K 4.2  BUN 20  CREATININE 1.00  GLUCOSE 145*     Assessment/Plan: 1 Day Post-Op Procedure(s) (LRB): TOTAL KNEE ARTHROPLASTY (Left)   HV drain d/c'ed Foley cath d/c'ed Advance diet Up with therapy D/C IV fluids Plan for discharge tomorrow to home if they do well with PT   Anastasio Auerbach. Duyen Beckom   PAC  11/20/2011, 8:17 AM

## 2011-11-20 NOTE — Discharge Summary (Signed)
Physician Discharge Summary  Patient ID: CREE KUNERT MRN: [id number] DOB/AGE: [birth year] 76 y.o.  Admit date: 11/19/2011 Discharge date: 11/21/2011   Procedures:  Procedure(s) (LRB): TOTAL KNEE ARTHROPLASTY (Left)  Attending Physician:  Dr. Durene Romans   Admission Diagnoses: End-stage osteoarthritis, left knee  Discharge Diagnoses:  Principal Problem:  *S/P left TKA Hypothyroidism Hypertension Hyperlipidemia Allergies BPH  HPI: This is an 78 year old gentleman with a history of end-stage osteoarthritis of his left knee that has failed conservative management. After discussion of treatment, benefits, risks, and options, the patient is now scheduled for total knee arthroplasty of the left knee. Note that his medical doctor is Dr. Guerry Bruin, his cardiologist is Dr. Elease Hashimoto. He is planning on going home after his surgery. He is a candidate for tranexamic acid and dexamethasone and will receive both at surgery. He is given his home medicines of iron, aspirin, Robaxin, MiraLAX and Colace.  PCP: No primary provider on file.   Discharged Condition: good  Hospital Course:  Patient underwent the above stated procedure on 11/19/2011. Patient tolerated the procedure well and brought to the recovery room in good condition and subsequently to the floor.  POD #1 BP: 108/67 ; Pulse: 74 ; Temp: 98 F (36.7 C) ; Resp: 16  Pt's foley was removed, as well as the hemovac drain removed. IV was changed to a saline lock. Patient reports pain as mild. Pain well controlled. No events throughout the night. Neurovascular intact, dorsiflexion/plantar flexion intact, incision: dressing C/D/I, no cellulitis present and compartment soft.   LABS  Basename  11/20/11 0415  HGB  10.5  HCT  30.6    POD #2  BP: 118/68 ; Pulse: 76 ; Temp: 97.8 F (36.6 C) ; Resp: 18  Patient reports pain as mild, pain well controlled with medication. No events throughout the night. Ready to be discharged  home. Neurovascular intact, dorsiflexion/plantar flexion intact, incision: dressing C/D/I, no cellulitis present and compartment soft.   LABS  Basename  11/21/11 0407   HGB  11.0  HCT  32.3     Discharge Exam: General appearance: alert, cooperative and no distress Extremities: Homans sign is negative, no sign of DVT, no edema, redness or tenderness in the calves or thighs and no ulcers, gangrene or trophic changes  Disposition: Home  with follow up in 2 weeks  Follow-up Information    Follow up with OLIN,Anica Alcaraz D in 2 weeks.   Contact information:   [facility] [address], [address] [place] [address] [phone]          Discharge Orders    Future Orders Please Complete By Expires   Diet - low sodium heart healthy      Call MD / Call 911      Comments:   If you experience chest pain or shortness of breath, CALL 911 and be transported to the hospital emergency room.  If you develope a fever above 101 F, pus (white drainage) or increased drainage or redness at the wound, or calf pain, call your surgeon's office.   Discharge instructions      Comments:   Maintain surgical dressing for 8 days, then replace with gauze and tape. Keep the area dry and clean until follow up. Follow up in 2 weeks at [facility] Call with any questions or concerns.     Constipation Prevention      Comments:   Drink plenty of fluids.  Prune juice may be helpful.  You may use  a stool softener, such as Colace (over the counter) 100 mg twice a day.  Use MiraLax (over the counter) for constipation as needed.   Increase activity slowly as tolerated      Weight Bearing as taught in Physical Therapy      Comments:   Use a walker or crutches as instructed.   Driving restrictions      Comments:   No driving for 4 weeks   TED hose      Comments:   Use stockings (TED hose) for 2 weeks on both leg(s).  You may remove them at night for sleeping.    Change dressing      Comments:   Maintain surgical dressing for 8 days, then change the dressing daily with sterile 4 x 4 inch gauze dressing and tape. Keep the area dry and clean.      Current Discharge Medication List    START taking these medications   Details  aspirin EC 325 MG tablet Take 1 tablet (325 mg total) by mouth 2 (two) times daily. X 4 weeks Qty: 60 tablet, Refills: 0    diphenhydrAMINE (BENADRYL) 25 mg capsule Take 1 capsule (25 mg total) by mouth every 6 (six) hours as needed for itching, allergies or sleep.    docusate sodium 100 MG CAPS Take 100 mg by mouth 2 (two) times daily.    ferrous sulfate 325 (65 FE) MG tablet Take 1 tablet (325 mg total) by mouth 3 (three) times daily after meals.    HYDROcodone-acetaminophen (NORCO) 7.5-325 MG per tablet Take 1-2 tablets by mouth every 4 (four) hours. Qty: 120 tablet, Refills: 0    methocarbamol (ROBAXIN) 500 MG tablet Take 1 tablet (500 mg total) by mouth every 6 (six) hours as needed (muscle spasms).    polyethylene glycol (MIRALAX / GLYCOLAX) packet Take 17 g by mouth 2 (two) times daily.      CONTINUE these medications which have NOT CHANGED   Details  fexofenadine (ALLEGRA) 180 MG tablet Take 180 mg by mouth daily.    glucosamine-chondroitin 500-400 MG tablet Take 1 tablet by mouth 3 (three) times daily.    levothyroxine (SYNTHROID, LEVOTHROID) 75 MCG tablet Take 75 mcg by mouth daily.    simvastatin (ZOCOR) 20 MG tablet Take 20 mg by mouth every evening.    losartan-hydrochlorothiazide (HYZAAR) 50-12.5 MG per tablet Take 1 tablet by mouth daily.      STOP taking these medications     acetaminophen (TYLENOL) 500 MG tablet Comments:  Reason for Stopping:       naproxen sodium (ANAPROX) 220 MG tablet Comments:  Reason for Stopping:            Signed:  Anastasio Auerbach. Dayanne Yiu   PAC  11/21/2011, 8:03 AM

## 2011-11-20 NOTE — Plan of Care (Signed)
Problem: Consults Goal: Diagnosis- Total Joint Replacement Primary Total Knee Left     

## 2011-11-21 LAB — CBC
HCT: 32.3 % — ABNORMAL LOW (ref 39.0–52.0)
Hemoglobin: 11 g/dL — ABNORMAL LOW (ref 13.0–17.0)
WBC: 13.9 10*3/uL — ABNORMAL HIGH (ref 4.0–10.5)

## 2011-11-21 LAB — BASIC METABOLIC PANEL
BUN: 18 mg/dL (ref 6–23)
Chloride: 102 mEq/L (ref 96–112)
GFR calc Af Amer: 85 mL/min — ABNORMAL LOW (ref >90)
Glucose, Bld: 155 mg/dL — ABNORMAL HIGH (ref 70–99)
Potassium: 4 mEq/L (ref 3.5–5.1)

## 2011-11-21 NOTE — Progress Notes (Addendum)
Physical Therapy Treatment Patient Details Name: [name] [name] MRN: [id number] DOB: Jan 19, 1923 Today's Date: 11/21/2011 Time: 4098-1191 PT Time Calculation (min): 14 min  PT Assessment / Plan / Recommendation Comments on Treatment Session  Pt did very well this morning with ambulation and stairs.  Pt able to perform SLR well and did not use KI today with mobility.  Pt and spouse have no questions/concerns about d/c home.  Pt and spouse instructed in safe car transfer.    Follow Up Recommendations  Home health PT    Barriers to Discharge        Equipment Recommendations  3 in 1 bedside comode;Rolling walker with 5" wheels    Recommendations for Other Services    Frequency     Plan Discharge plan remains appropriate;Frequency remains appropriate    Precautions / Restrictions Precautions Precautions: Knee Required Braces or Orthoses: Knee Immobilizer - Left Knee Immobilizer - Left: Discontinue once straight leg raise with < 10 degree lag Restrictions LLE Weight Bearing: Weight bearing as tolerated   Pertinent Vitals/Pain     Mobility  Transfers Transfers: Stand to Sit;Sit to Stand Sit to Stand: With upper extremity assist;From chair/3-in-1;5: Supervision Stand to Sit: With upper extremity assist;5: Supervision;To chair/3-in-1 Details for Transfer Assistance: pt able to perform SLR, verbal cue to keep L LE forward Ambulation/Gait Ambulation/Gait Assistance: 5: Supervision Ambulation Distance (Feet): 220 Feet Assistive device: Rolling walker Ambulation/Gait Assistance Details: pt did very well with ambulation without KI Gait Pattern: Step-to pattern;Decreased stance time - left Gait velocity: decreased Stairs: Yes Stairs Assistance: 4: Min guard Stairs Assistance Details (indicate cue type and reason): verbal cue for safe technique and sequence, spouse present and educated Stair Management Technique: Step to pattern;Forwards;With walker Number of Stairs: 1  (x2)      Exercises     PT Diagnosis:    PT Problem List:   PT Treatment Interventions:     PT Goals Acute Rehab PT Goals PT Goal: Sit to Stand - Progress: Met PT Goal: Stand to Sit - Progress: Met PT Goal: Ambulate - Progress: Met PT Goal: Up/Down Stairs - Progress: Progressing toward goal  Visit Information  Last PT Received On: 11/21/11 Assistance Needed: +1    Subjective Data  Subjective: "I'm feeling good."   Cognition  Overall Cognitive Status: Appears within functional limits for tasks assessed/performed    Balance     End of Session PT - End of Session Activity Tolerance: Patient tolerated treatment well Patient left: with call bell/phone within reach;with family/visitor present;in chair    Desire Fulp,[name] 11/21/2011, 10:07 AM Pager: [phone]

## 2011-11-21 NOTE — Care Management Note (Signed)
    Page 1 of 2   11/21/2011     11:34:22 AM   CARE MANAGEMENT NOTE 11/21/2011  Patient:  [name] [name], [name] [name]   Account Number:  [id number]  Date Initiated:  11/20/2011  Documentation initiated by:  Colleen Can  Subjective/Objective Assessment:   dx osteoarthritis left knee; total knee replacemnt     Action/Plan:   Patient is currently asleep.  CM will up tomorrow  Libert sates doctor's office referred pt to there agency-[facility] choice tomorrow   Anticipated DC Date:  11/21/2011   Anticipated DC Plan:  HOME W HOME HEALTH SERVICES  In-house referral  NA      DC Planning Services  CM consult      [facility] Choice  HOME HEALTH   Choice offered to / List presented to:  C-1 Patient   DME arranged  [facility]st      DME agency  [facility]     HH arranged  HH-2 PT      [facility] agency  [facility]   Status of service:  Completed, signed off Medicare Important Message given?  NA - LOS <3 / Initial given by admissions (If response is "NO", the following Medicare IM given date fields will be blank) Date Medicare IM given:   Date Additional Medicare IM given:    Discharge Disposition:  HOME W HOME HEALTH SERVICES  Per UR Regulation:    If discussed at Long Length of Stay Meetings, dates discussed:    Comments:  11/21/2011 Raynelle Bring BSN CCM [phone] Home health chioce offered. List of agencies placed on shadow chart also. Pt and spouse chose [facility] for services. [facility] rep called and advised that start of services would be 11/22/2011.  HH orders, face sheet, op note and H&P faxed to [phone] with confirmation-spoke with Clydie Braun

## 2011-11-21 NOTE — Progress Notes (Signed)
Subjective: 2 Days Post-Op Procedure(s) (LRB): TOTAL KNEE ARTHROPLASTY (Left)   Patient reports pain as mild, pain well controlled with medication. No events throughout the night. Ready to be discharged home.  Objective:   VITALS:   Filed Vitals:   11/21/11 0615  BP: 118/68  Pulse: 76  Temp: 97.8 F (36.6 C)  Resp: 18    Neurovascular intact Dorsiflexion/Plantar flexion intact Incision: dressing C/D/I No cellulitis present Compartment soft  LABS  Basename 11/21/11 0407 11/20/11 0415  HGB 11.0* 10.5*  HCT 32.3* 30.6*  WBC 13.9* 11.7*  PLT 117* 122*     Basename 11/21/11 0407 11/20/11 0415  NA 136 136  K 4.0 4.2  BUN 18 20  CREATININE 0.96 1.00  GLUCOSE 155* 145*     Assessment/Plan: 2 Days Post-Op Procedure(s) (LRB): TOTAL KNEE ARTHROPLASTY (Left)   Up with therapy Discharge home with home health after PT Follow up in 2 weeks at [facility].  Follow-up Information    Follow up with OLIN,Layden Caterino D in 2 weeks.   Contact information:   [facility] [address] [place] [address] [phone]          Anastasio Auerbach. Rylyn Ranganathan   PAC  11/21/2011, 7:59 AM

## 2012-09-08 ENCOUNTER — Encounter: Payer: Self-pay | Admitting: Cardiovascular Disease

## 2012-09-08 ENCOUNTER — Ambulatory Visit (INDEPENDENT_AMBULATORY_CARE_PROVIDER_SITE_OTHER): Payer: Medicare Other | Admitting: Cardiovascular Disease

## 2012-09-08 VITALS — BP 146/86 | HR 69 | Ht 66.0 in | Wt 171.0 lb

## 2012-09-08 DIAGNOSIS — I10 Essential (primary) hypertension: Secondary | ICD-10-CM

## 2012-09-08 DIAGNOSIS — R011 Cardiac murmur, unspecified: Secondary | ICD-10-CM

## 2012-09-08 NOTE — Patient Instructions (Addendum)
Your physician has requested that you have an echocardiogram. Echocardiography is a painless test that uses sound waves to create images of your heart. It provides your doctor with information about the size and shape of your heart and how well your heart's chambers and valves are working. This procedure takes approximately one hour. There are no restrictions for this procedure.  Your physician recommends that you schedule a follow-up appointment in: AS NEEDED BASIS   Your physician recommends that you continue on your current medications as directed. Please refer to the Current Medication list given to you today.

## 2012-09-08 NOTE — Progress Notes (Signed)
Felecia Shelling Date of Birth  [birth year]       [facility] Office [address], [address]  [address] [place], [place]  [address]   [place], [place]  [address] [phone]     [phone]   Fax  [phone]    Fax [phone]  Problem List: 1. Hypertension 2. Hypothyroidism 3. Hyperlipidemia  History of Present Illness:  I met Mr. Deguia in 2000.  He had a cardiac cath which showed not significant problems.  He has had some hypertension recently.   He had knee replacement Nov 19, 2011 [facility]). His Losartan was held because of low / normal BP.   Home health care monitored his BP for a while and his BP stayed normal.  He was recently re-started on the Losartan by Dr. Wylene Simmer.   He had an episode of orthostasis (passed out in the middle of the night while going to the bathroom)  EMS took him to [facility] and he was monitored and released.    He has had several additional episodes of HTN.   He was started on Amlodipine 5 mg a day 2 weeks ago.   He has been keeping a BP log and his readings have been in the normal range.     Current Outpatient Prescriptions on File Prior to Visit  Medication Sig Dispense Refill  . fexofenadine (ALLEGRA) 180 MG tablet Take 180 mg by mouth daily.      Marland Kitchen glucosamine-chondroitin 500-400 MG tablet Take 1 tablet by mouth 3 (three) times daily.      Marland Kitchen levothyroxine (SYNTHROID, LEVOTHROID) 75 MCG tablet Take 75 mcg by mouth daily.      . simvastatin (ZOCOR) 20 MG tablet Take 20 mg by mouth every evening.       No current facility-administered medications on file prior to visit.    No Known Allergies  Past Medical History  Diagnosis Date  . Complication of anesthesia     had confusion after surg  . Hypertension   . Hyperlipemia   . Arthritis   . History of kidney stones   . Nocturia   . Slow urinary stream   . Hypothyroidism     Past Surgical History  Procedure Laterality Date  .  Tonsillectomy    . Cystoscopy  2006    for kidney stone  . Total knee arthroplasty  11/19/2011    Procedure: TOTAL KNEE ARTHROPLASTY;  Surgeon: [name], MD;  Location: [facility]S;  Service: Orthopedics;  Laterality: Left;    History  Smoking status  . Never Smoker   Smokeless tobacco  . Not on file    History  Alcohol Use No    No family history on file.  Reviw of Systems:  Reviewed in the HPI.  All other systems are negative.  Physical Exam: Blood pressure 146/86, pulse 69, height 5\' 6"  (1.676 m), weight 171 lb (77.565 kg). General: Well developed, well nourished, in no acute distress.  Head: Normocephalic, atraumatic, sclera non-icteric, mucus membranes are moist,   Neck: Supple. Carotids are 2 + without bruits. No JVD   Lungs: Clear   Heart: RR, S1, S2, soft diastolic murmur  Abdomen: Soft, non-tender, non-distended with normal bowel sounds.  Msk:  Strength and tone are normal   Extremities: No clubbing or cyanosis. No edema.  Distal pedal pulses are 2+ and equal    Neuro: CN II - XII  intact.  Alert and oriented X 3.   Psych:  Normal   ECG: September 08, 2012:  NSR at 48.  Normal ECG  Assessment / Plan:

## 2012-09-08 NOTE — Assessment & Plan Note (Addendum)
[name] [name] is an 79 yo with hx of HTN.  He's had some labile blood pressure readings recently. He was previously on losartan but for some reason was switched to amlodipine. Apparently his blood pressure was not as well controlled on losartan.  His blood pressure looks good today. His blood pressure wall reveals mostly normal blood pressure readings. He has occasional elevated blood pressure.  He has a soft diastolic murmur.  We will get an echo and will call him with those results.  I've encouraged him to continue with his current medications. At this point I do not see any reason to switch him from a different medication. He's tolerating the low dose amlodipine and his readings are fine. I've asked him to watch his salt intake.  He'll followup with his general medical Dr. Diamantina Providence see him again on as-needed basis.

## 2012-09-17 ENCOUNTER — Ambulatory Visit (HOSPITAL_COMMUNITY): Payer: Medicare Other | Attending: Cardiology | Admitting: Radiology

## 2012-09-17 DIAGNOSIS — R011 Cardiac murmur, unspecified: Secondary | ICD-10-CM

## 2012-09-17 DIAGNOSIS — I079 Rheumatic tricuspid valve disease, unspecified: Secondary | ICD-10-CM | POA: Insufficient documentation

## 2012-09-17 DIAGNOSIS — I059 Rheumatic mitral valve disease, unspecified: Secondary | ICD-10-CM | POA: Insufficient documentation

## 2012-09-17 NOTE — Progress Notes (Signed)
Mr. Echocardiogram performed.  

## 2012-10-20 ENCOUNTER — Encounter: Payer: Self-pay | Admitting: Cardiovascular Disease

## 2012-12-10 NOTE — Progress Notes (Signed)
Surgery scheduled for 12/29/12.  Preop 12/21/12 at 1000.  Need orders in EPIC.  Thank You.

## 2012-12-15 ENCOUNTER — Encounter (HOSPITAL_COMMUNITY): Payer: Self-pay | Admitting: Pharmacy Technician

## 2012-12-21 ENCOUNTER — Encounter (HOSPITAL_COMMUNITY): Payer: Self-pay

## 2012-12-21 ENCOUNTER — Encounter (HOSPITAL_COMMUNITY)
Admission: RE | Admit: 2012-12-21 | Discharge: 2012-12-21 | Disposition: A | Discharge status: Home / Self Care | Payer: Medicare Other | Source: Ambulatory Visit | Attending: Orthopedic Surgery | Admitting: Orthopedic Surgery

## 2012-12-21 ENCOUNTER — Ambulatory Visit (HOSPITAL_COMMUNITY)
Admission: RE | Admit: 2012-12-21 | Discharge: 2012-12-21 | Disposition: A | Discharge status: Home / Self Care | Payer: Medicare Other | Source: Ambulatory Visit | Attending: Orthopedic Surgery | Admitting: Orthopedic Surgery

## 2012-12-21 DIAGNOSIS — Z01812 Encounter for preprocedural laboratory examination: Secondary | ICD-10-CM | POA: Insufficient documentation

## 2012-12-21 DIAGNOSIS — Z01818 Encounter for other preprocedural examination: Secondary | ICD-10-CM | POA: Insufficient documentation

## 2012-12-21 HISTORY — DX: Other seasonal allergic rhinitis: J30.2

## 2012-12-21 LAB — URINALYSIS, ROUTINE W REFLEX MICROSCOPIC
Bilirubin Urine: NEGATIVE
Hgb urine dipstick: NEGATIVE
Ketones, ur: NEGATIVE mg/dL
Nitrite: NEGATIVE
Specific Gravity, Urine: 1.021 (ref 1.005–1.030)
pH: 5 (ref 5.0–8.0)

## 2012-12-21 LAB — CBC
HCT: 38 % — ABNORMAL LOW (ref 39.0–52.0)
Hemoglobin: 12.6 g/dL — ABNORMAL LOW (ref 13.0–17.0)
MCH: 30.9 pg (ref 26.0–34.0)
MCHC: 33.2 g/dL (ref 30.0–36.0)
RDW: 12.5 % (ref 11.5–15.5)

## 2012-12-21 LAB — BASIC METABOLIC PANEL
BUN: 15 mg/dL (ref 6–23)
Chloride: 105 mEq/L (ref 96–112)
Creatinine, Ser: 0.91 mg/dL (ref 0.50–1.35)
GFR calc Af Amer: 86 mL/min — ABNORMAL LOW (ref >90)
GFR calc non Af Amer: 74 mL/min — ABNORMAL LOW (ref >90)
Glucose, Bld: 110 mg/dL — ABNORMAL HIGH (ref 70–99)

## 2012-12-21 LAB — PROTIME-INR: INR: 1.02 (ref 0.00–1.49)

## 2012-12-21 LAB — SURGICAL PCR SCREEN: Staphylococcus aureus: NEGATIVE

## 2012-12-21 NOTE — Progress Notes (Signed)
EKG, LOV Dr Melburn Popper with eccho results   3/14 EPIC.  CLEARANCE WITH NOTE  Dr Wylene Simmer on  CHART

## 2012-12-21 NOTE — Patient Instructions (Addendum)
20 [name] [name]  12/21/2012   Your procedure is scheduled on:  12/29/12  TUESDAY  Report to [facility] at [phone]      AM.  Call this number if you have problems the morning of surgery: [phone]       Remember:   Do not eat food  Or drink :After Midnight.MONDAY NIGHT   Take these medicines the morning of surgery with A SIP OF WATER:    Levothyroxine, Allegra   .  Contacts, dentures or partial plates can not be worn to surgery  Leave suitcase in the car. After surgery it may be brought to your room.  For patients admitted to the hospital, checkout time is 11:00 AM day of  discharge.             SPECIAL INSTRUCTIONS- SEE [facility] PREPARING FOR SURGERY INSTRUCTION SHEET-     DO NOT WEAR JEWELRY, LOTIONS, POWDERS, OR PERFUMES.  WOMEN-- DO NOT SHAVE LEGS OR UNDERARMS FOR 12 HOURS BEFORE SHOWERS. MEN MAY SHAVE FACE.  Patients discharged the day of surgery will not be allowed to drive home. IF going home the day of surgery, you must have a driver and someone to stay with you for the first 24 hours  Name and phone number of your driver:     ADMISSION                                                                   Please read over the following fact sheets that you were given: MRSA Information, Incentive Spirometry Sheet, Blood Transfusion Sheet  Information                                                                                   Shawny Borkowski  PST 336  [phone]                 FAILURE TO FOLLOW THESE INSTRUCTIONS MAY RESULT IN  CANCELLATION   OF YOUR SURGERY                                                  Patient Signature _____________________________

## 2012-12-24 NOTE — H&P (Signed)
TOTAL KNEE ADMISSION H&P  Patient is being admitted for right total knee arthroplasty.  Subjective:  Chief Complaint:  Right knee OA / pain.  HPI: JORI THRALL, 79 y.o. male, has a history of pain and functional disability in the right knee due to arthritis and has failed non-surgical conservative treatments for greater than 12 weeks to includeNSAID's and/or analgesics and activity modification.  Onset of symptoms was gradual, starting >10 years ago with gradually worsening course since that time. The patient noted prior procedures on the knee to include  arthroplasty on the left knee(s).  Patient currently rates pain in the right knee(s) at 7 out of 10 with activity. Patient has worsening of pain with activity and weight bearing, pain that interferes with activities of daily living, pain with passive range of motion, crepitus and joint swelling.  Patient has evidence of periarticular osteophytes and joint space narrowing by imaging studies. There is no active infection.  Risks, benefits and expectations were discussed with the patient. Patient understand the risks, benefits and expectations and wishes to proceed with surgery.   D/C Plans:   Home with HHPT  Post-op Meds:   Rx given for ASA, Zanaflex, Iron, Colace and MiraLax  Tranexamic Acid:   To be given  Decadron:    To be given  FYI:    ASA post-op   Patient Active Problem List   Diagnosis Date Noted  . S/P left TKA 11/19/2011  . COLONIC POLYPS 09/03/2007  . HYPERLIPIDEMIA 09/03/2007  . HYPERTENSION 09/03/2007  . ALLERGIC RHINITIS 09/03/2007  . DIVERTICULOSIS OF COLON 09/03/2007  . RENAL CALCULUS 09/03/2007  . ARTHRITIS 09/03/2007   Past Medical History  Diagnosis Date  . Hypertension   . Hyperlipemia   . Arthritis   . History of kidney stones   . Nocturia   . Slow urinary stream   . Hypothyroidism   . Complication of anesthesia     had confusion after surg/  "OK following spinal anesth"  . Seasonal allergies      Past Surgical History  Procedure Laterality Date  . Tonsillectomy    . Cystoscopy  2006    for kidney stone  . Total knee arthroplasty  11/19/2011    Procedure: TOTAL KNEE ARTHROPLASTY;  Surgeon: [name], MD;  Location: [facility];  Service: Orthopedics;  Laterality: Left;     No Known Allergies   History  Substance Use Topics  . Smoking status: Never Smoker   . Smokeless tobacco: Never Used  . Alcohol Use: No    No family history on file.   Review of Systems  Constitutional: Negative.   HENT: Positive for hearing loss.   Eyes: Negative.   Respiratory: Negative.   Cardiovascular: Negative.   Gastrointestinal: Negative.   Genitourinary: Positive for frequency.  Musculoskeletal: Positive for myalgias and joint pain.  Skin: Negative.   Neurological: Negative.   Endo/Heme/Allergies: Negative.   Psychiatric/Behavioral: Negative.     Objective:  Physical Exam  Constitutional: He is oriented to person, place, and time. He appears well-developed and well-nourished.  HENT:  Head: Normocephalic and atraumatic.  Mouth/Throat: Oropharynx is clear and moist.  Eyes: Pupils are equal, round, and reactive to light.  Neck: Neck supple. No JVD present. No tracheal deviation present. No thyromegaly present.  Cardiovascular: Normal rate, regular rhythm, normal heart sounds and intact distal pulses.   Respiratory: Effort normal and breath sounds normal. No stridor.  GI: Soft. There is no tenderness. There is no guarding.  Musculoskeletal:  Right knee: He exhibits decreased range of motion, swelling and bony tenderness. He exhibits no effusion, no ecchymosis, no deformity, no laceration and no erythema. Tenderness found. Medial joint line and lateral joint line tenderness noted.  Lymphadenopathy:    He has no cervical adenopathy.  Neurological: He is alert and oriented to person, place, and time.  Skin: Skin is warm and dry.  Psychiatric: He has a normal mood and affect.     Labs:  Estimated body mass index is 27.61 kg/(m^2) as calculated from the following:   Height as of 09/08/12: 5\' 6"  (1.676 m).   Weight as of 09/08/12: 77.565 kg (171 lb).   Imaging Review Plain radiographs demonstrate severe degenerative joint disease of the right knee(s). The overall alignment is neutral. The bone quality appears to be good for age and reported activity level.  Assessment/Plan:  End stage arthritis, right knee   The patient history, physical examination, clinical judgment of the provider and imaging studies are consistent with end stage degenerative joint disease of the right knee(s) and total knee arthroplasty is deemed medically necessary. The treatment options including medical management, injection therapy arthroscopy and arthroplasty were discussed at length. The risks and benefits of total knee arthroplasty were presented and reviewed. The risks due to aseptic loosening, infection, stiffness, patella tracking problems, thromboembolic complications and other imponderables were discussed. The patient acknowledged the explanation, agreed to proceed with the plan and consent was signed. Patient is being admitted for inpatient treatment for surgery, pain control, PT, OT, prophylactic antibiotics, VTE prophylaxis, progressive ambulation and ADL's and discharge planning. The patient is planning to be discharged home with home health services.    Anastasio Auerbach Zalen Sequeira   PAC  12/24/2012, 2:59 PM

## 2012-12-29 ENCOUNTER — Inpatient Hospital Stay (HOSPITAL_COMMUNITY)
Admission: RE | Admit: 2012-12-29 | Discharge: 2012-12-30 | DRG: 470 | Disposition: A | Discharge status: Home Health | Payer: Medicare Other | Source: Ambulatory Visit | Attending: Orthopedic Surgery | Admitting: Orthopedic Surgery

## 2012-12-29 ENCOUNTER — Encounter (HOSPITAL_COMMUNITY)
Admission: RE | Disposition: A | Discharge status: Home Health | Payer: Self-pay | Source: Ambulatory Visit | Attending: Orthopedic Surgery

## 2012-12-29 ENCOUNTER — Encounter (HOSPITAL_COMMUNITY): Payer: Self-pay | Admitting: Anesthesiology

## 2012-12-29 ENCOUNTER — Inpatient Hospital Stay (HOSPITAL_COMMUNITY): Payer: Medicare Other | Admitting: Anesthesiology

## 2012-12-29 ENCOUNTER — Encounter (HOSPITAL_COMMUNITY): Payer: Self-pay | Admitting: *Deleted

## 2012-12-29 DIAGNOSIS — E663 Overweight: Secondary | ICD-10-CM | POA: Diagnosis present

## 2012-12-29 DIAGNOSIS — Z96659 Presence of unspecified artificial knee joint: Secondary | ICD-10-CM

## 2012-12-29 DIAGNOSIS — Z96651 Presence of right artificial knee joint: Secondary | ICD-10-CM

## 2012-12-29 DIAGNOSIS — E039 Hypothyroidism, unspecified: Secondary | ICD-10-CM | POA: Diagnosis present

## 2012-12-29 DIAGNOSIS — Z6827 Body mass index (BMI) 27.0-27.9, adult: Secondary | ICD-10-CM

## 2012-12-29 DIAGNOSIS — M1710 Unilateral primary osteoarthritis, unspecified knee: Principal | ICD-10-CM | POA: Diagnosis present

## 2012-12-29 DIAGNOSIS — Z860100 Personal history of colon polyps, unspecified: Secondary | ICD-10-CM

## 2012-12-29 DIAGNOSIS — Z87442 Personal history of urinary calculi: Secondary | ICD-10-CM

## 2012-12-29 DIAGNOSIS — Z79899 Other long term (current) drug therapy: Secondary | ICD-10-CM

## 2012-12-29 DIAGNOSIS — I10 Essential (primary) hypertension: Secondary | ICD-10-CM | POA: Diagnosis present

## 2012-12-29 DIAGNOSIS — E785 Hyperlipidemia, unspecified: Secondary | ICD-10-CM | POA: Diagnosis present

## 2012-12-29 DIAGNOSIS — D62 Acute posthemorrhagic anemia: Secondary | ICD-10-CM | POA: Diagnosis not present

## 2012-12-29 DIAGNOSIS — D500 Iron deficiency anemia secondary to blood loss (chronic): Secondary | ICD-10-CM | POA: Diagnosis not present

## 2012-12-29 HISTORY — PX: TOTAL KNEE ARTHROPLASTY: SHX125

## 2012-12-29 LAB — TYPE AND SCREEN
ABO/RH(D): A POS
Antibody Screen: NEGATIVE

## 2012-12-29 SURGERY — ARTHROPLASTY, KNEE, TOTAL
Anesthesia: Spinal | Site: Knee | Laterality: Right | Wound class: Clean

## 2012-12-29 MED ORDER — LORATADINE 10 MG PO TABS
10.0000 mg | ORAL_TABLET | Freq: Every day | ORAL | Status: DC
  Administered 2012-12-29 – 2012-12-30 (×2): 10 mg via ORAL
  Filled 2012-12-29 (×2): qty 1

## 2012-12-29 MED ORDER — PROPOFOL INFUSION 10 MG/ML OPTIME
INTRAVENOUS | Status: DC | PRN
  Administered 2012-12-29: 75 ug/kg/min via INTRAVENOUS

## 2012-12-29 MED ORDER — EPHEDRINE SULFATE 50 MG/ML IJ SOLN
INTRAMUSCULAR | Status: DC | PRN
  Administered 2012-12-29 (×3): 5 mg via INTRAVENOUS

## 2012-12-29 MED ORDER — CEFAZOLIN SODIUM 1-5 GM-% IV SOLN
1.0000 g | Freq: Four times a day (QID) | INTRAVENOUS | Status: AC
  Administered 2012-12-29 – 2012-12-30 (×2): 1 g via INTRAVENOUS
  Filled 2012-12-29 (×2): qty 50

## 2012-12-29 MED ORDER — PHENOL 1.4 % MT LIQD: 1.0000 | OROMUCOSAL | Status: DC | PRN

## 2012-12-29 MED ORDER — PROPOFOL 10 MG/ML IV BOLUS
INTRAVENOUS | Status: DC | PRN
  Administered 2012-12-29: 20 mg via INTRAVENOUS

## 2012-12-29 MED ORDER — ASPIRIN EC 325 MG PO TBEC
325.0000 mg | DELAYED_RELEASE_TABLET | Freq: Two times a day (BID) | ORAL | Status: DC
  Administered 2012-12-30: 325 mg via ORAL
  Filled 2012-12-29 (×2): qty 1

## 2012-12-29 MED ORDER — SODIUM CHLORIDE 0.9 % IR SOLN
Status: DC | PRN
  Administered 2012-12-29: 1000 mL

## 2012-12-29 MED ORDER — METOCLOPRAMIDE HCL 10 MG PO TABS: 5.0000 mg | ORAL_TABLET | Freq: Three times a day (TID) | ORAL | Status: DC | PRN

## 2012-12-29 MED ORDER — LACTATED RINGERS IV SOLN
INTRAVENOUS | Status: DC
  Administered 2012-12-29: 1000 mL via INTRAVENOUS

## 2012-12-29 MED ORDER — ACETAMINOPHEN 325 MG PO TABS: 650.0000 mg | ORAL_TABLET | Freq: Four times a day (QID) | ORAL | Status: DC | PRN

## 2012-12-29 MED ORDER — HYDROMORPHONE HCL PF 1 MG/ML IJ SOLN: 0.5000 mg | INTRAMUSCULAR | Status: DC | PRN

## 2012-12-29 MED ORDER — ACETAMINOPHEN 650 MG RE SUPP: 650.0000 mg | Freq: Four times a day (QID) | RECTAL | Status: DC | PRN

## 2012-12-29 MED ORDER — SIMVASTATIN 20 MG PO TABS
20.0000 mg | ORAL_TABLET | Freq: Every evening | ORAL | Status: DC
  Administered 2012-12-29: 20 mg via ORAL
  Filled 2012-12-29 (×2): qty 1

## 2012-12-29 MED ORDER — MENTHOL 3 MG MT LOZG: 1.0000 | LOZENGE | OROMUCOSAL | Status: DC | PRN

## 2012-12-29 MED ORDER — DEXAMETHASONE SODIUM PHOSPHATE 10 MG/ML IJ SOLN
10.0000 mg | Freq: Once | INTRAMUSCULAR | Status: DC
  Filled 2012-12-29: qty 1

## 2012-12-29 MED ORDER — METOCLOPRAMIDE HCL 5 MG/ML IJ SOLN: 5.0000 mg | Freq: Three times a day (TID) | INTRAMUSCULAR | Status: DC | PRN

## 2012-12-29 MED ORDER — DOCUSATE SODIUM 100 MG PO CAPS
100.0000 mg | ORAL_CAPSULE | Freq: Two times a day (BID) | ORAL | Status: DC
Start: 2012-12-29 — End: 2012-12-30
  Administered 2012-12-29 – 2012-12-30 (×2): 100 mg via ORAL

## 2012-12-29 MED ORDER — FERROUS SULFATE 325 (65 FE) MG PO TABS
325.0000 mg | ORAL_TABLET | Freq: Three times a day (TID) | ORAL | Status: DC
Start: 2012-12-29 — End: 2012-12-30
  Administered 2012-12-29 – 2012-12-30 (×3): 325 mg via ORAL
  Filled 2012-12-29 (×5): qty 1

## 2012-12-29 MED ORDER — FENTANYL CITRATE 0.05 MG/ML IJ SOLN: 25.0000 ug | INTRAMUSCULAR | Status: DC | PRN

## 2012-12-29 MED ORDER — CELECOXIB 200 MG PO CAPS
200.0000 mg | ORAL_CAPSULE | Freq: Two times a day (BID) | ORAL | Status: DC
  Administered 2012-12-29 – 2012-12-30 (×2): 200 mg via ORAL
  Filled 2012-12-29 (×3): qty 1

## 2012-12-29 MED ORDER — 0.9 % SODIUM CHLORIDE (POUR BTL) OPTIME
TOPICAL | Status: DC | PRN
  Administered 2012-12-29: 1000 mL

## 2012-12-29 MED ORDER — PHENYLEPHRINE HCL 10 MG/ML IJ SOLN
INTRAMUSCULAR | Status: DC | PRN
  Administered 2012-12-29: 40 ug via INTRAVENOUS
  Administered 2012-12-29 (×2): 80 ug via INTRAVENOUS
  Administered 2012-12-29: 40 ug via INTRAVENOUS

## 2012-12-29 MED ORDER — PROMETHAZINE HCL 25 MG/ML IJ SOLN: 6.2500 mg | INTRAMUSCULAR | Status: DC | PRN

## 2012-12-29 MED ORDER — TRANEXAMIC ACID 100 MG/ML IV SOLN
1000.0000 mg | Freq: Once | INTRAVENOUS | Status: AC
  Administered 2012-12-29: 1000 mg via INTRAVENOUS
  Filled 2012-12-29: qty 10

## 2012-12-29 MED ORDER — AMLODIPINE BESYLATE 5 MG PO TABS
5.0000 mg | ORAL_TABLET | Freq: Every evening | ORAL | Status: DC
  Administered 2012-12-29: 5 mg via ORAL
  Filled 2012-12-29 (×2): qty 1

## 2012-12-29 MED ORDER — LEVOTHYROXINE SODIUM 75 MCG PO TABS
75.0000 ug | ORAL_TABLET | Freq: Every day | ORAL | Status: DC
  Administered 2012-12-30: 75 ug via ORAL
  Filled 2012-12-29 (×2): qty 1

## 2012-12-29 MED ORDER — BUPIVACAINE IN DEXTROSE 0.75-8.25 % IT SOLN
INTRATHECAL | Status: DC | PRN
  Administered 2012-12-29: 1.6 mL via INTRATHECAL

## 2012-12-29 MED ORDER — ONDANSETRON HCL 4 MG/2ML IJ SOLN
INTRAMUSCULAR | Status: DC | PRN
  Administered 2012-12-29: 4 mg via INTRAVENOUS

## 2012-12-29 MED ORDER — KETOROLAC TROMETHAMINE 30 MG/ML IJ SOLN
INTRAMUSCULAR | Status: DC | PRN
  Administered 2012-12-29: 30 mg

## 2012-12-29 MED ORDER — CEFAZOLIN SODIUM-DEXTROSE 2-3 GM-% IV SOLR
2.0000 g | INTRAVENOUS | Status: AC
  Administered 2012-12-29: 2 g via INTRAVENOUS

## 2012-12-29 MED ORDER — BUPIVACAINE LIPOSOME 1.3 % IJ SUSP
20.0000 mL | Freq: Once | INTRAMUSCULAR | Status: DC
  Filled 2012-12-29: qty 20

## 2012-12-29 MED ORDER — FENTANYL CITRATE 0.05 MG/ML IJ SOLN
INTRAMUSCULAR | Status: DC | PRN
  Administered 2012-12-29 (×2): 25 ug via INTRAVENOUS

## 2012-12-29 MED ORDER — DEXAMETHASONE SODIUM PHOSPHATE 10 MG/ML IJ SOLN
10.0000 mg | Freq: Once | INTRAMUSCULAR | Status: AC
  Administered 2012-12-29: 10 mg via INTRAVENOUS

## 2012-12-29 MED ORDER — BUPIVACAINE LIPOSOME 1.3 % IJ SUSP
INTRAMUSCULAR | Status: DC | PRN
  Administered 2012-12-29: 20 mL

## 2012-12-29 MED ORDER — ONDANSETRON HCL 4 MG/2ML IJ SOLN: 4.0000 mg | Freq: Four times a day (QID) | INTRAMUSCULAR | Status: DC | PRN

## 2012-12-29 MED ORDER — HYDROCODONE-ACETAMINOPHEN 7.5-325 MG PO TABS
1.0000 | ORAL_TABLET | Freq: Four times a day (QID) | ORAL | Status: DC
  Administered 2012-12-29 – 2012-12-30 (×4): 1 via ORAL
  Filled 2012-12-29: qty 1
  Filled 2012-12-29: qty 2
  Filled 2012-12-29: qty 1
  Filled 2012-12-29: qty 2

## 2012-12-29 MED ORDER — LIDOCAINE HCL (CARDIAC) 20 MG/ML IV SOLN
INTRAVENOUS | Status: DC | PRN
  Administered 2012-12-29: 100 mg via INTRAVENOUS

## 2012-12-29 MED ORDER — METHOCARBAMOL 500 MG PO TABS
500.0000 mg | ORAL_TABLET | Freq: Four times a day (QID) | ORAL | Status: DC | PRN
  Administered 2012-12-29: 500 mg via ORAL
  Filled 2012-12-29: qty 1

## 2012-12-29 MED ORDER — ONDANSETRON HCL 4 MG PO TABS: 4.0000 mg | ORAL_TABLET | Freq: Four times a day (QID) | ORAL | Status: DC | PRN

## 2012-12-29 MED ORDER — METHOCARBAMOL 100 MG/ML IJ SOLN
500.0000 mg | Freq: Four times a day (QID) | INTRAVENOUS | Status: DC | PRN
  Filled 2012-12-29: qty 5

## 2012-12-29 MED ORDER — BUPIVACAINE-EPINEPHRINE PF 0.25-1:200000 % IJ SOLN
INTRAMUSCULAR | Status: DC | PRN
  Administered 2012-12-29: 25 mL

## 2012-12-29 MED ORDER — SODIUM CHLORIDE 0.9 % IJ SOLN
INTRAMUSCULAR | Status: DC | PRN
  Administered 2012-12-29: 14 mL

## 2012-12-29 MED ORDER — POLYETHYLENE GLYCOL 3350 17 G PO PACK: 17.0000 g | PACK | Freq: Every day | ORAL | Status: DC | PRN

## 2012-12-29 MED ORDER — SODIUM CHLORIDE 0.9 % IV SOLN
INTRAVENOUS | Status: DC
  Administered 2012-12-29: 15:00:00 via INTRAVENOUS
  Filled 2012-12-29 (×4): qty 1000

## 2012-12-29 SURGICAL SUPPLY — 59 items
ADH SKN CLS APL DERMABOND .7 (GAUZE/BANDAGES/DRESSINGS) ×1
BAG SPEC THK2 15X12 ZIP CLS (MISCELLANEOUS) ×1
BAG ZIPLOCK 12X15 (MISCELLANEOUS) ×2 IMPLANT
BANDAGE ELASTIC 6 VELCRO ST LF (GAUZE/BANDAGES/DRESSINGS) ×2 IMPLANT
BANDAGE ESMARK 6X9 LF (GAUZE/BANDAGES/DRESSINGS) ×1 IMPLANT
BLADE SAW SGTL 13.0X1.19X90.0M (BLADE) ×2 IMPLANT
BNDG CMPR 9X6 STRL LF SNTH (GAUZE/BANDAGES/DRESSINGS) ×1
BNDG ESMARK 6X9 LF (GAUZE/BANDAGES/DRESSINGS) ×2
BOWL SMART MIX CTS (DISPOSABLE) ×2 IMPLANT
CAPT RP KNEE ×1 IMPLANT
CEMENT HV SMART SET (Cement) ×2 IMPLANT
CLOTH BEACON ORANGE TIMEOUT ST (SAFETY) ×2 IMPLANT
CUFF TOURN SGL QUICK 34 (TOURNIQUET CUFF) ×2
CUFF TRNQT CYL 34X4X40X1 (TOURNIQUET CUFF) ×1 IMPLANT
DECANTER SPIKE VIAL GLASS SM (MISCELLANEOUS) ×3 IMPLANT
DERMABOND ADVANCED (GAUZE/BANDAGES/DRESSINGS) ×1
DERMABOND ADVANCED .7 DNX12 (GAUZE/BANDAGES/DRESSINGS) ×1 IMPLANT
DRAPE EXTREMITY T 121X128X90 (DRAPE) ×2 IMPLANT
DRAPE POUCH INSTRU U-SHP 10X18 (DRAPES) ×2 IMPLANT
DRAPE U-SHAPE 47X51 STRL (DRAPES) ×2 IMPLANT
DRSG AQUACEL AG ADV 3.5X10 (GAUZE/BANDAGES/DRESSINGS) ×2 IMPLANT
DRSG TEGADERM 4X4.75 (GAUZE/BANDAGES/DRESSINGS) ×2 IMPLANT
DURAPREP 26ML APPLICATOR (WOUND CARE) ×2 IMPLANT
ELECT REM PT RETURN 9FT ADLT (ELECTROSURGICAL) ×2
ELECTRODE REM PT RTRN 9FT ADLT (ELECTROSURGICAL) ×1 IMPLANT
EVACUATOR 1/8 PVC DRAIN (DRAIN) ×2 IMPLANT
FACESHIELD LNG OPTICON STERILE (SAFETY) ×10 IMPLANT
GAUZE SPONGE 2X2 8PLY STRL LF (GAUZE/BANDAGES/DRESSINGS) ×1 IMPLANT
GLOVE BIOGEL PI IND STRL 7.5 (GLOVE) ×1 IMPLANT
GLOVE BIOGEL PI IND STRL 8 (GLOVE) ×1 IMPLANT
GLOVE BIOGEL PI INDICATOR 7.5 (GLOVE) ×1
GLOVE BIOGEL PI INDICATOR 8 (GLOVE) ×1
GLOVE ECLIPSE 8.0 STRL XLNG CF (GLOVE) ×2 IMPLANT
GLOVE ORTHO TXT STRL SZ7.5 (GLOVE) ×4 IMPLANT
GOWN BRE IMP PREV XXLGXLNG (GOWN DISPOSABLE) ×2 IMPLANT
GOWN STRL NON-REIN LRG LVL3 (GOWN DISPOSABLE) ×2 IMPLANT
HANDPIECE INTERPULSE COAX TIP (DISPOSABLE) ×2
KIT BASIN OR (CUSTOM PROCEDURE TRAY) ×2 IMPLANT
MANIFOLD NEPTUNE II (INSTRUMENTS) ×2 IMPLANT
NDL SAFETY ECLIPSE 18X1.5 (NEEDLE) ×1 IMPLANT
NEEDLE HYPO 18GX1.5 SHARP (NEEDLE) ×4
NS IRRIG 1000ML POUR BTL (IV SOLUTION) ×3 IMPLANT
PACK TOTAL JOINT (CUSTOM PROCEDURE TRAY) ×2 IMPLANT
POSITIONER SURGICAL ARM (MISCELLANEOUS) ×2 IMPLANT
SET HNDPC FAN SPRY TIP SCT (DISPOSABLE) ×1 IMPLANT
SET PAD KNEE POSITIONER (MISCELLANEOUS) ×2 IMPLANT
SPONGE GAUZE 2X2 STER 10/PKG (GAUZE/BANDAGES/DRESSINGS) ×1
SUCTION FRAZIER 12FR DISP (SUCTIONS) ×2 IMPLANT
SUT MNCRL AB 4-0 PS2 18 (SUTURE) ×2 IMPLANT
SUT VIC AB 1 CT1 36 (SUTURE) ×2 IMPLANT
SUT VIC AB 2-0 CT1 27 (SUTURE) ×6
SUT VIC AB 2-0 CT1 TAPERPNT 27 (SUTURE) ×3 IMPLANT
SUT VLOC 180 0 24IN GS25 (SUTURE) ×2 IMPLANT
SYR 3ML LL SCALE MARK (SYRINGE) ×1 IMPLANT
SYR 50ML LL SCALE MARK (SYRINGE) ×2 IMPLANT
TOWEL OR 17X26 10 PK STRL BLUE (TOWEL DISPOSABLE) ×4 IMPLANT
TRAY FOLEY CATH 14FRSI W/METER (CATHETERS) ×2 IMPLANT
WATER STERILE IRR 1500ML POUR (IV SOLUTION) ×3 IMPLANT
WRAP KNEE MAXI GEL POST OP (GAUZE/BANDAGES/DRESSINGS) ×2 IMPLANT

## 2012-12-29 NOTE — Preoperative (Signed)
Beta Blockers   Reason not to administer Beta Blockers:Not Applicable 

## 2012-12-29 NOTE — Op Note (Signed)
NAME:  [name] [name]                      MEDICAL RECORD NO.:  [id number]                             FACILITY:  [facility]      PHYSICIAN:  Madlyn Frankel. Charlann Boxer, M.D.  DATE OF BIRTH:  [birth year]      DATE OF PROCEDURE:  12/29/2012                                     OPERATIVE REPORT         PREOPERATIVE DIAGNOSIS:  Right knee osteoarthritis.      POSTOPERATIVE DIAGNOSIS:  Right knee osteoarthritis.      FINDINGS:  The patient was noted to have complete loss of cartilage and   bone-on-bone arthritis with associated osteophytes in the medial and patellofemoral compartments of   the knee with a significant synovitis and associated effusion.      PROCEDURE:  Right total knee replacement.      COMPONENTS USED:  DePuy rotating platform posterior stabilized knee   system, a size 4 femur, 5 tibia, 12.5 mm PS insert, and 41 patellar   button.      SURGEON:  Madlyn Frankel. Charlann Boxer, M.D.      ASSISTANT:  Lanney Gins, PA-C.      ANESTHESIA:  Spinal.      SPECIMENS:  None.      COMPLICATION:  None.      DRAINS:  One Hemovac.  EBL: <150cc      TOURNIQUET TIME:   Total Tourniquet Time Documented: Thigh (Right) - 42 minutes Total: Thigh (Right) - 42 minutes  .      The patient was stable to the recovery room.      INDICATION FOR PROCEDURE:  [name] [name] is a 79 y.o. male patient of   mine.  The patient had been seen, evaluated, and treated conservatively in the   office with medication, activity modification, and injections.  The patient had   radiographic changes of bone-on-bone arthritis with endplate sclerosis and osteophytes noted.      The patient failed conservative measures including medication, injections, and activity modification, and at this point was ready for more definitive measures.   Based on the radiographic changes and failed conservative measures, the patient   decided to proceed with total knee replacement.  Risks of infection,   DVT, component failure, need for  revision surgery, postop course, and   expectations were all   discussed and reviewed.  Consent was obtained for benefit of pain   relief.      PROCEDURE IN DETAIL:  The patient was brought to the operative theater.   Once adequate anesthesia, preoperative antibiotics, 2 gm of Ancef administered, the patient was positioned supine with the right thigh tourniquet placed.  The  right lower extremity was prepped and draped in sterile fashion.  A time-   out was performed identifying the patient, planned procedure, and   extremity.      The right lower extremity was placed in the [facility] leg holder.  The leg was   exsanguinated, tourniquet elevated to 250 mmHg.  A midline incision was   made followed by median parapatellar arthrotomy.  Following initial   exposure, attention was  first directed to the patella.  Precut   measurement was noted to be 26 mm.  I resected down to 14-15 mm and used a   41 patellar button to restore patellar height as well as cover the cut   surface.      The lug holes were drilled and a metal shim was placed to protect the   patella from retractors and saw blades.      At this point, attention was now directed to the femur.  The femoral   canal was opened with a drill, irrigated to try to prevent fat emboli.  An   intramedullary rod was passed at 5 degrees valgus, 10 mm of bone was   resected off the distal femur.  Following this resection, the tibia was   subluxated anteriorly.  Using the extramedullary guide, 10 mm of bone was resected off   the proximal lateral tibia.  We confirmed the gap would be   stable medially and laterally with a 10 mm insert as well as confirmed   the cut was perpendicular in the coronal plane, checking with an alignment rod.      Once this was done, I sized the femur to be a size 4 in the anterior-   posterior dimension, chose a standard component based on medial and   lateral dimension.  The size 4 rotation block was then pinned in    position anterior referenced using the C-clamp to set rotation.  The   anterior, posterior, and  chamfer cuts were made without difficulty nor   notching making certain that I was along the anterior cortex to help   with flexion gap stability.      The final box cut was made off the lateral aspect of distal femur.      At this point, the tibia was sized to be a size 5, the size 5 tray was   then pinned in position through the medial third of the tubercle,   drilled, and keel punched.  Trial reduction was now carried with a 4 femur,  5 tibia, a 12.5 mm insert, and the 41 patella botton.  The knee was brought to   extension, full extension with good flexion stability with the patella   tracking through the trochlea without application of pressure.  Given   all these findings, the trial components removed.  Final components were   opened and cement was mixed.  The knee was irrigated with normal saline   solution and pulse lavage.  The synovial lining was   then injected with 0.25% Marcaine with epinephrine and 1 cc of Toradol,   total of 61 cc.      The knee was irrigated.  Final implants were then cemented onto clean and   dried cut surfaces of bone with the knee brought to extension with a 12.5 mm trial insert.      Once the cement had fully cured, the excess cement was removed   throughout the knee.  I confirmed I was satisfied with the range of   motion and stability, and the final 12.5 mm posterior stabilized insert was chosen.  It was   placed into the knee.      The tourniquet had been let down at 42 minutes.  No significant   hemostasis required.  The medium Hemovac drain was placed deep.  The   extensor mechanism was then reapproximated using #1 Vicryl with the knee   in flexion.  The  remaining wound was closed with 2-0 Vicryl and running 4-0 Monocryl.   The knee was cleaned, dried, dressed sterilely using Dermabond and   Aquacel dressing.  Drain site dressed separately.  The  patient was then   brought to recovery room in stable condition, tolerating the procedure   well.   Please note that Physician Assistant, Lanney Gins, was present for the entirety of the case, and was utilized for pre-operative positioning, peri-operative retractor management, general facilitation of the procedure.  He was also utilized for primary wound closure at the end of the case.              Madlyn Frankel Charlann Boxer, M.D.

## 2012-12-29 NOTE — Interval H&P Note (Signed)
History and Physical Interval Note:  12/29/2012 10:24 AM  [name] [name]  has presented today for surgery, with the diagnosis of Osteoarthritis of the Right Knee  The various methods of treatment have been discussed with the patient and family. After consideration of risks, benefits and other options for treatment, the patient has consented to  Procedure(s): RIGHT TOTAL KNEE ARTHROPLASTY (Right) as a surgical intervention .  The patient's history has been reviewed, patient examined, no change in status, stable for surgery.  I have reviewed the patient's chart and labs.  Questions were answered to the patient's satisfaction.     [name]

## 2012-12-29 NOTE — Anesthesia Procedure Notes (Signed)

## 2012-12-29 NOTE — Transfer of Care (Signed)
Immediate Anesthesia Transfer of Care Note  Patient: [name] [name]  Procedure(s) Performed: Procedure(s): RIGHT TOTAL KNEE ARTHROPLASTY (Right)  Patient Location: PACU  Anesthesia Type:MAC and Spinal  Level of Consciousness: awake, alert , oriented and patient cooperative  Airway & Oxygen Therapy: Patient Spontanous Breathing and Patient connected to face mask oxygen  Post-op Assessment: Report given to PACU RN and Post -op Vital signs reviewed and stable  Post vital signs: Reviewed and stable  Complications: No apparent anesthesia complications

## 2012-12-29 NOTE — Progress Notes (Signed)
Utilization review completed.  

## 2012-12-29 NOTE — Anesthesia Preprocedure Evaluation (Addendum)
Anesthesia Evaluation  Patient identified by MRN, date of birth, ID band Patient awake    Reviewed: Allergy & Precautions, H&P , NPO status , Patient's Chart, lab work & pertinent test results  History of Anesthesia Complications (+) AWARENESS UNDER ANESTHESIA  Airway Mallampati: II TM Distance: >3 FB Neck ROM: Full    Dental no notable dental hx.    Pulmonary neg pulmonary ROS,  breath sounds clear to auscultation  Pulmonary exam normal       Cardiovascular hypertension, Pt. on medications Rhythm:Regular Rate:Normal     Neuro/Psych negative neurological ROS  negative psych ROS   GI/Hepatic negative GI ROS, Neg liver ROS,   Endo/Other  Hypothyroidism   Renal/GU negative Renal ROS  negative genitourinary   Musculoskeletal negative musculoskeletal ROS (+)   Abdominal   Peds negative pediatric ROS (+)  Hematology negative hematology ROS (+)   Anesthesia Other Findings   Reproductive/Obstetrics negative OB ROS                          Anesthesia Physical Anesthesia Plan  ASA: II  Anesthesia Plan: Spinal   Post-op Pain Management:    Induction: Intravenous  Airway Management Planned: Simple Face Mask  Additional Equipment:   Intra-op Plan:   Post-operative Plan:   Informed Consent: I have reviewed the patients History and Physical, chart, labs and discussed the procedure including the risks, benefits and alternatives for the proposed anesthesia with the patient or authorized representative who has indicated his/her understanding and acceptance.     Plan Discussed with: CRNA and Surgeon  Anesthesia Plan Comments:         Anesthesia Quick Evaluation

## 2012-12-30 ENCOUNTER — Encounter (HOSPITAL_COMMUNITY): Payer: Self-pay | Admitting: Orthopedic Surgery

## 2012-12-30 DIAGNOSIS — E663 Overweight: Secondary | ICD-10-CM

## 2012-12-30 DIAGNOSIS — D500 Iron deficiency anemia secondary to blood loss (chronic): Secondary | ICD-10-CM | POA: Diagnosis not present

## 2012-12-30 LAB — BASIC METABOLIC PANEL
Chloride: 107 mEq/L (ref 96–112)
GFR calc Af Amer: 87 mL/min — ABNORMAL LOW (ref >90)
GFR calc non Af Amer: 75 mL/min — ABNORMAL LOW (ref >90)
Potassium: 4 mEq/L (ref 3.5–5.1)
Sodium: 138 mEq/L (ref 135–145)

## 2012-12-30 LAB — CBC
MCHC: 34 g/dL (ref 30.0–36.0)
Platelets: 111 10*3/uL — ABNORMAL LOW (ref 150–400)
RDW: 12.7 % (ref 11.5–15.5)

## 2012-12-30 MED ORDER — HYDROCODONE-ACETAMINOPHEN 7.5-325 MG PO TABS: 1.0000 | ORAL_TABLET | ORAL | Status: DC | PRN

## 2012-12-30 MED ORDER — DSS 100 MG PO CAPS: 100.0000 mg | ORAL_CAPSULE | Freq: Two times a day (BID) | ORAL | Status: DC

## 2012-12-30 MED ORDER — TIZANIDINE HCL 4 MG PO CAPS: 4.0000 mg | ORAL_CAPSULE | Freq: Three times a day (TID) | ORAL | Status: DC

## 2012-12-30 MED ORDER — POLYETHYLENE GLYCOL 3350 17 G PO PACK: 17.0000 g | PACK | Freq: Every day | ORAL | Status: DC | PRN

## 2012-12-30 MED ORDER — ASPIRIN 325 MG PO TBEC: 325.0000 mg | DELAYED_RELEASE_TABLET | Freq: Two times a day (BID) | ORAL | Status: AC

## 2012-12-30 MED ORDER — TIZANIDINE HCL 4 MG PO CAPS: 4.0000 mg | ORAL_CAPSULE | Freq: Three times a day (TID) | ORAL | Status: DC | PRN

## 2012-12-30 MED ORDER — FERROUS SULFATE 325 (65 FE) MG PO TABS: 325.0000 mg | ORAL_TABLET | Freq: Three times a day (TID) | ORAL | Status: DC

## 2012-12-30 NOTE — Discharge Summary (Signed)
Physician Discharge Summary  Patient ID: [name] [name] MRN: [id number] DOB/AGE: [birth year] 77 y.o.  Admit date: 12/29/2012 Discharge date:  12/30/2012  Procedures:  Procedure(s) (LRB): RIGHT TOTAL KNEE ARTHROPLASTY (Right)  Attending Physician:  Dr. Durene Romans   Admission Diagnoses:   Right knee OA / pain  Discharge Diagnoses:  Principal Problem:   S/P right TKA Active Problems:   Expected blood loss anemia   Overweight (BMI 25.0-29.9)  Past Medical History  Diagnosis Date  . Hypertension   . Hyperlipemia   . Arthritis   . History of kidney stones   . Nocturia   . Slow urinary stream   . Hypothyroidism   . Complication of anesthesia     had confusion after surg/  "OK following spinal anesth"  . Seasonal allergies     HPI:    [name] [name], 79 y.o. male, has a history of pain and functional disability in the right knee due to arthritis and has failed non-surgical conservative treatments for greater than 12 weeks to includeNSAID's and/or analgesics and activity modification. Onset of symptoms was gradual, starting >10 years ago with gradually worsening course since that time. The patient noted prior procedures on the knee to include arthroplasty on the left knee(s). Patient currently rates pain in the right knee(s) at 7 out of 10 with activity. Patient has worsening of pain with activity and weight bearing, pain that interferes with activities of daily living, pain with passive range of motion, crepitus and joint swelling. Patient has evidence of periarticular osteophytes and joint space narrowing by imaging studies. There is no active infection. Risks, benefits and expectations were discussed with the patient. Patient understand the risks, benefits and expectations and wishes to proceed with surgery.   PCP: Gaspar Garbe, MD   Discharged Condition: good  Hospital Course:  Patient underwent the above stated procedure on 12/29/2012. Patient tolerated the procedure  well and brought to the recovery room in good condition and subsequently to the floor.  POD #1 BP: 107/58 ; Pulse: 73 ; Temp: 98.4 F (36.9 C) ; Resp: 16  Pt's foley was removed, as well as the hemovac drain removed. IV was changed to a saline lock. Patient reports pain as mild, pain well controlled. No events throughout the night. Ready to be discharged home. Neurovascular intact, dorsiflexion/plantar flexion intact, incision: dressing C/D/I, no cellulitis present and compartment soft.   LABS  Basename  12/30/12    0419   HGB  10.3  HCT  30.3    Discharge Exam: General appearance: alert, cooperative and no distress Extremities: Homans sign is negative, no sign of DVT, no edema, redness or tenderness in the calves or thighs and no ulcers, gangrene or trophic changes  Disposition:   Home-Health Care Svc with follow up in 2 weeks   Follow-up Information   Follow up with [name], MD. Schedule an appointment as soon as possible for a visit in 2 weeks.   Contact information:   [address] [place] [address] [phone]       Discharge Orders   Future Orders Complete By Expires     Call MD / Call 911  As directed     Comments:      If you experience chest pain or shortness of breath, CALL 911 and be transported to the hospital emergency room.  If you develope a fever above 101 F, pus (white drainage) or increased drainage or redness at the wound, or calf pain,  call your surgeon's office.    Change dressing  As directed     Comments:      Maintain surgical dressing for 10-14 days, then change the dressing daily with sterile 4 x 4 inch gauze dressing and tape. Keep the area dry and clean.    Constipation Prevention  As directed     Comments:      Drink plenty of fluids.  Prune juice may be helpful.  You may use a stool softener, such as Colace (over the counter) 100 mg twice a day.  Use MiraLax (over the counter) for constipation as needed.    Diet - low  sodium heart healthy  As directed     Discharge instructions  As directed     Comments:      Maintain surgical dressing for 10-14 days, then replace with gauze and tape. Keep the area dry and clean until follow up. Follow up in 2 weeks at [facility]. Call with any questions or concerns.    Driving restrictions  As directed     Comments:      No driving for 4 weeks    Increase activity slowly as tolerated  As directed     TED hose  As directed     Comments:      Use stockings (TED hose) for 2 weeks on both leg(s).  You may remove them at night for sleeping.    Weight bearing as tolerated  As directed          Medication List    TAKE these medications       amLODipine 5 MG tablet  Commonly known as:  NORVASC  Take 5 mg by mouth every evening.     aspirin 325 MG EC tablet  Take 1 tablet (325 mg total) by mouth 2 (two) times daily.     DSS 100 MG Caps  Take 100 mg by mouth 2 (two) times daily.     ferrous sulfate 325 (65 FE) MG tablet  Take 1 tablet (325 mg total) by mouth 3 (three) times daily after meals.     fexofenadine 180 MG tablet  Commonly known as:  ALLEGRA  Take 180 mg by mouth daily.     glucosamine-chondroitin 500-400 MG tablet  Take 1 tablet by mouth daily.     HYDROcodone-acetaminophen 7.5-325 MG per tablet  Commonly known as:  NORCO  Take 1 tablet by mouth every 4 (four) hours as needed for pain.     levothyroxine 75 MCG tablet  Commonly known as:  SYNTHROID, LEVOTHROID  Take 75 mcg by mouth daily before breakfast.     polyethylene glycol packet  Commonly known as:  MIRALAX / GLYCOLAX  Take 17 g by mouth daily as needed.     simvastatin 20 MG tablet  Commonly known as:  ZOCOR  Take 20 mg by mouth every evening.     tiZANidine 4 MG capsule  Commonly known as:  ZANAFLEX  Take 1 capsule (4 mg total) by mouth 3 (three) times daily as needed for muscle spasms. Muscle spasms         Signed: Anastasio Auerbach. Aaronjames Kelsay   PAC  12/30/2012, 11:44  AM

## 2012-12-30 NOTE — Progress Notes (Signed)
Physical Therapy Treatment Patient Details Name: [name] [name] MRN: [id number] DOB: 1922/08/17 Today's Date: 12/30/2012 Time: 1208-1225 PT Time Calculation (min): 17 min  PT Assessment / Plan / Recommendation  PT Comments   Pt progressing well and to d/c this pm with follow up HHPT  Follow Up Recommendations  Home health PT     Does the patient have the potential to tolerate intense rehabilitation     Barriers to Discharge        Equipment Recommendations  None recommended by PT    Recommendations for Other Services    Frequency 7X/week   Progress towards PT Goals Progress towards PT goals: Progressing toward goals  Plan Current plan remains appropriate    Precautions / Restrictions Precautions Precautions: Fall Restrictions Weight Bearing Restrictions: No Other Position/Activity Restrictions: WBAT   Pertinent Vitals/Pain Min c/o pain    Mobility  Transfers Transfers: Sit to Stand;Stand to Sit Sit to Stand: 5: Supervision Stand to Sit: 5: Supervision Details for Transfer Assistance: min cues for use of UEs Ambulation/Gait Ambulation/Gait Assistance: 5: Supervision Ambulation Distance (Feet): 222 Feet Assistive device: Rolling walker Ambulation/Gait Assistance Details: cues for posture and position from RW Gait Pattern: Step-to pattern;Step-through pattern Stairs: Yes Stairs Assistance: 4: Min assist Stairs Assistance Details (indicate cue type and reason): cues for sequence and foot/RW placement Stair Management Technique: No rails;Forwards;Step to pattern;With walker Number of Stairs: 1    Exercises     PT Diagnosis:    PT Problem List:   PT Treatment Interventions:     PT Goals Acute Rehab PT Goals Patient Stated Goal: Home today  Visit Information  Last PT Received On: 12/30/12 Assistance Needed: +1    Subjective Data  Subjective: I am ready for you, just let me get my hearing aides in Patient Stated Goal: Home today   Cognition   Cognition Arousal/Alertness: Awake/alert Behavior During Therapy: WFL for tasks assessed/performed Overall Cognitive Status: Within Functional Limits for tasks assessed    Balance     End of Session PT - End of Session Equipment Utilized During Treatment: Gait belt Activity Tolerance: Patient tolerated treatment well Patient left: in chair;with call bell/phone within reach;with family/visitor present Nurse Communication: Mobility status   GP     [name] [name] 12/30/2012, 12:35 PM

## 2012-12-30 NOTE — Evaluation (Signed)
Physical Therapy Evaluation Patient Details Name: [name] [name] MRN: [id number] DOB: 05-12-1923 Today's Date: 12/30/2012 Time: 8295-6213 PT Time Calculation (min): 32 min  PT Assessment / Plan / Recommendation Clinical Impression  Pt s/p R TKR presents with decreased R LE strength/ROM and post op discomfort limiting functional mobility.  Pt will benefit from ST HHPT at discharge to maximize IND    PT Assessment  Patient needs continued PT services    Follow Up Recommendations  Home health PT    Does the patient have the potential to tolerate intense rehabilitation      Barriers to Discharge        Equipment Recommendations  None recommended by PT    Recommendations for Other Services     Frequency 7X/week    Precautions / Restrictions Precautions Precautions: Fall Restrictions Weight Bearing Restrictions: No Other Position/Activity Restrictions: WBAT   Pertinent Vitals/Pain 2/10; premed, cold packs provided      Mobility  Bed Mobility Bed Mobility: Supine to Sit Supine to Sit: 5: Supervision Transfers Transfers: Sit to Stand;Stand to Sit Sit to Stand: 4: Min guard Stand to Sit: 4: Min guard Details for Transfer Assistance: min cues for use of UEs Ambulation/Gait Ambulation/Gait Assistance: 4: Min Environmental consultant (Feet): 111 Feet Assistive device: Rolling walker Ambulation/Gait Assistance Details: cues for posture, sequence and position from RW Gait Pattern: Step-to pattern;Step-through pattern Stairs: No    Exercises Total Joint Exercises Ankle Circles/Pumps: AROM;15 reps;Supine;Both Quad Sets: AROM;10 reps;Supine;Both Heel Slides: AAROM;10 reps;Supine;Right Straight Leg Raises: Right;10 reps;AROM;Supine   PT Diagnosis: Difficulty walking  PT Problem List: Decreased strength;Decreased range of motion;Decreased activity tolerance;Decreased mobility;Decreased knowledge of use of DME;Pain PT Treatment Interventions: DME instruction;Gait  training;Stair training;Functional mobility training;Therapeutic activities;Therapeutic exercise;Patient/family education     PT Goals(Current goals can be found in the care plan section) Acute Rehab PT Goals Patient Stated Goal: Home today PT Goal Formulation: With patient Time For Goal Achievement: 01/01/13 Potential to Achieve Goals: Good  Visit Information  Last PT Received On: 12/30/12 Assistance Needed: +1       Prior Functioning  Home Living Family/patient expects to be discharged to:: Private residence Living Arrangements: Spouse/significant other Available Help at Discharge: Family Type of Home: House Home Access: Stairs to enter Secretary/administrator of Steps: 1+1 Entrance Stairs-Rails: None Home Layout: One level Home Equipment: Environmental consultant - 2 wheels Prior Function Level of Independence: Independent Communication Communication: No difficulties;HOH Dominant Hand: Right    Cognition  Cognition Arousal/Alertness: Awake/alert Behavior During Therapy: WFL for tasks assessed/performed Overall Cognitive Status: Within Functional Limits for tasks assessed    Extremity/Trunk Assessment Upper Extremity Assessment Upper Extremity Assessment: Overall WFL for tasks assessed Lower Extremity Assessment Lower Extremity Assessment: Overall WFL for tasks assessed Cervical / Trunk Assessment Cervical / Trunk Assessment: Normal   Balance    End of Session PT - End of Session Equipment Utilized During Treatment: Gait belt Activity Tolerance: Patient tolerated treatment well Patient left: in chair;with call bell/phone within reach;with family/visitor present Nurse Communication: Mobility status  GP     Mahoganie Basher 12/30/2012, 8:54 AM

## 2012-12-30 NOTE — Progress Notes (Signed)
   Subjective: 1 Day Post-Op Procedure(s) (LRB): RIGHT TOTAL KNEE ARTHROPLASTY (Right)   Patient reports pain as mild, pain well controlled. No events throughout the night. Ready to be discharged home.  Objective:   VITALS:   Filed Vitals:   12/30/12 0533  BP: 107/58  Pulse: 73  Temp: 98.4 F (36.9 C)  Resp: 16    Neurovascular intact Dorsiflexion/Plantar flexion intact Incision: dressing C/D/I No cellulitis present Compartment soft  LABS  Recent Labs  12/30/12 0419  HGB 10.3*  HCT 30.3*  WBC 13.0*  PLT 111*     Recent Labs  12/30/12 0419  NA 138  K 4.0  BUN 15  CREATININE 0.88  GLUCOSE 143*     Assessment/Plan: 1 Day Post-Op Procedure(s) (LRB): RIGHT TOTAL KNEE ARTHROPLASTY (Right) HV drain d/c'ed Foley cath d/c'ed Advance diet Up with therapy D/C IV fluids Discharge home with home health Follow up in 2 weeks at [facility]. Follow up with OLIN,Jaylyn Booher D in 2 weeks.  Contact information:  [facility] [address] [place] [address] [phone]    Expected ABLA  Treated with iron and will observe  Overweight (BMI 25-29.9) Estimated body mass index is 27.94 kg/(m^2) as calculated from the following:   Height as of this encounter: 5\' 6"  (1.676 m).   Weight as of this encounter: 78.472 kg (173 lb). Patient also counseled that weight may inhibit the healing process Patient counseled that losing weight will help with future health issues      Anastasio Auerbach. Leniya Breit   PAC  12/30/2012, 8:15 AM

## 2012-12-30 NOTE — Progress Notes (Signed)
OT Cancellation Note  Patient Details Name: [name] [name] MRN: [id number] DOB: Sep 10, 1922   Cancelled Treatment:    Reason Eval/Treat Not Completed: OT screened, no needs identified, will sign off  Ashden Sonnenberg 12/30/2012, 9:11 AM Marica Otter, OTR/L [phone] 12/30/2012

## 2012-12-31 NOTE — Anesthesia Postprocedure Evaluation (Signed)
  Anesthesia Post-op Note  Patient: [name] [name]  Procedure(s) Performed: Procedure(s) (LRB): RIGHT TOTAL KNEE ARTHROPLASTY (Right)  Patient Location: PACU  Anesthesia Type: Spinal  Level of Consciousness: awake and alert   Airway and Oxygen Therapy: Patient Spontanous Breathing  Post-op Pain: mild  Post-op Assessment: Post-op Vital signs reviewed, Patient's Cardiovascular Status Stable, Respiratory Function Stable, Patent Airway and No signs of Nausea or vomiting  Last Vitals:  Filed Vitals:   12/30/12 1200  BP:   Pulse:   Temp:   Resp: 15    Post-op Vital Signs: stable   Complications: No apparent anesthesia complications

## 2017-12-04 ENCOUNTER — Encounter (HOSPITAL_COMMUNITY): Payer: Self-pay | Admitting: Emergency Medicine

## 2017-12-04 ENCOUNTER — Emergency Department (HOSPITAL_COMMUNITY)
Admission: EM | Admit: 2017-12-04 | Discharge: 2017-12-05 | Disposition: A | Discharge status: Home / Self Care | Payer: Medicare Other | Attending: Emergency Medicine | Admitting: Emergency Medicine

## 2017-12-04 ENCOUNTER — Emergency Department (HOSPITAL_COMMUNITY): Payer: Medicare Other

## 2017-12-04 DIAGNOSIS — Z79899 Other long term (current) drug therapy: Secondary | ICD-10-CM | POA: Diagnosis not present

## 2017-12-04 DIAGNOSIS — J302 Other seasonal allergic rhinitis: Secondary | ICD-10-CM | POA: Diagnosis not present

## 2017-12-04 DIAGNOSIS — I10 Essential (primary) hypertension: Secondary | ICD-10-CM | POA: Diagnosis not present

## 2017-12-04 DIAGNOSIS — R5381 Other malaise: Secondary | ICD-10-CM | POA: Insufficient documentation

## 2017-12-04 DIAGNOSIS — M549 Dorsalgia, unspecified: Secondary | ICD-10-CM | POA: Diagnosis present

## 2017-12-04 DIAGNOSIS — R5383 Other fatigue: Secondary | ICD-10-CM

## 2017-12-04 DIAGNOSIS — E039 Hypothyroidism, unspecified: Secondary | ICD-10-CM | POA: Diagnosis not present

## 2017-12-04 LAB — CBC
HCT: 38.3 % — ABNORMAL LOW (ref 39.0–52.0)
Hemoglobin: 12.8 g/dL — ABNORMAL LOW (ref 13.0–17.0)
MCH: 32 pg (ref 26.0–34.0)
MCHC: 33.4 g/dL (ref 30.0–36.0)
MCV: 95.8 fL (ref 78.0–100.0)
Platelets: 92 10*3/uL — ABNORMAL LOW (ref 150–400)
RBC: 4 MIL/uL — AB (ref 4.22–5.81)
RDW: 13 % (ref 11.5–15.5)
WBC: 4.1 10*3/uL (ref 4.0–10.5)

## 2017-12-04 LAB — COMPREHENSIVE METABOLIC PANEL
ALK PHOS: 52 U/L (ref 38–126)
ALT: 12 U/L — ABNORMAL LOW (ref 17–63)
ANION GAP: 8 (ref 5–15)
AST: 27 U/L (ref 15–41)
Albumin: 4.4 g/dL (ref 3.5–5.0)
BILIRUBIN TOTAL: 1.6 mg/dL — AB (ref 0.3–1.2)
BUN: 14 mg/dL (ref 6–20)
CALCIUM: 9.2 mg/dL (ref 8.9–10.3)
CO2: 25 mmol/L (ref 22–32)
Chloride: 109 mmol/L (ref 101–111)
Creatinine, Ser: 1.18 mg/dL (ref 0.61–1.24)
GFR, EST AFRICAN AMERICAN: 60 mL/min — AB (ref >60)
GFR, EST NON AFRICAN AMERICAN: 52 mL/min — AB (ref >60)
Glucose, Bld: 112 mg/dL — ABNORMAL HIGH (ref 65–99)
Potassium: 4.7 mmol/L (ref 3.5–5.1)
SODIUM: 142 mmol/L (ref 135–145)
TOTAL PROTEIN: 7.5 g/dL (ref 6.5–8.1)

## 2017-12-04 LAB — URINALYSIS, ROUTINE W REFLEX MICROSCOPIC
Bilirubin Urine: NEGATIVE
GLUCOSE, UA: NEGATIVE mg/dL
KETONES UR: NEGATIVE mg/dL
Leukocytes, UA: NEGATIVE
NITRITE: NEGATIVE
PH: 5 (ref 5.0–8.0)
PROTEIN: NEGATIVE mg/dL
Specific Gravity, Urine: 1.02 (ref 1.005–1.030)

## 2017-12-04 LAB — LIPASE, BLOOD: Lipase: 26 U/L (ref 11–51)

## 2017-12-04 NOTE — ED Provider Notes (Signed)
[facility] DEPT Provider Note   CSN: [id number] Arrival date & time: 12/04/17  1755     History   Chief Complaint Chief Complaint  Patient presents with  . Back Pain    HPI [name] [name] is a 84 y.o. male.  84 year old male with a history of hypertension, dyslipidemia, hypothyroid presents to the emergency department for evaluation of back pain.  Patient with a history of chronic low back pain.  This has been worse in the past, but is persistent daily.  The wife attributes this to "severe degeneration".  The patient notes the pain is not bad at this time.  Wife became concerned that back pain may be due to a urine infection as patient had a temperature of 100.3 F at home.  He has also been experiencing anorexia with increased fatigue.  He was last given 2 tablets of Tylenol at 1500.  Wife called the patient's primary care office who advised that he come to the emergency department for evaluation of possible febrile illness.  Patient denies any headache, chest pain, shortness of breath, abdominal pain, nausea, vomiting, diarrhea.  Wife states that he has a cough chronically at baseline.  This has not specifically worsened lately.  The history is provided by the patient and the spouse. No language interpreter was used.  Back Pain      Past Medical History:  Diagnosis Date  . Arthritis   . Complication of anesthesia    had confusion after surg/  "OK following spinal anesth"  . History of kidney stones   . Hyperlipemia   . Hypertension   . Hypothyroidism   . Nocturia   . Seasonal allergies   . Slow urinary stream     Patient Active Problem List   Diagnosis Date Noted  . Expected blood loss anemia 12/30/2012  . Overweight (BMI 25.0-29.9) 12/30/2012  . S/P right TKA 12/29/2012  . COLONIC POLYPS 09/03/2007  . HYPERLIPIDEMIA 09/03/2007  . HYPERTENSION 09/03/2007  . ALLERGIC RHINITIS 09/03/2007  . DIVERTICULOSIS OF COLON 09/03/2007  . RENAL  CALCULUS 09/03/2007  . ARTHRITIS 09/03/2007    Past Surgical History:  Procedure Laterality Date  . CYSTOSCOPY  2006   for kidney stone  . TONSILLECTOMY    . TOTAL KNEE ARTHROPLASTY  11/19/2011   Procedure: TOTAL KNEE ARTHROPLASTY;  Surgeon: Mauri Pole, MD;  Location: [facility];  Service: Orthopedics;  Laterality: Left;  . TOTAL KNEE ARTHROPLASTY Right 12/29/2012   Procedure: RIGHT TOTAL KNEE ARTHROPLASTY;  Surgeon: Mauri Pole, MD;  Location: [facility];  Service: Orthopedics;  Laterality: Right;        Home Medications    Prior to Admission medications   Medication Sig Start Date End Date Taking? Authorizing Provider  acetaminophen (TYLENOL) 500 MG tablet Take 500 mg by mouth every 6 (six) hours as needed for mild pain.   Yes [provider]  escitalopram (LEXAPRO) 10 MG tablet Take 10 mg by mouth daily.   Yes [provider]  fexofenadine (ALLEGRA) 180 MG tablet Take 180 mg by mouth daily.   Yes [provider]  levothyroxine (SYNTHROID, LEVOTHROID) 75 MCG tablet Take 75 mcg by mouth daily before breakfast.    Yes [provider]  simvastatin (ZOCOR) 20 MG tablet Take 20 mg by mouth every evening.   Yes [provider]  docusate sodium 100 MG CAPS Take 100 mg by mouth 2 (two) times daily. Patient not taking: Reported on 12/04/2017 12/30/12  Danae Orleans, PA-C  ferrous sulfate 325 (65 FE) MG tablet Take 1 tablet (325 mg total) by mouth 3 (three) times daily after meals. Patient not taking: Reported on 12/04/2017 12/30/12   Danae Orleans, PA-C  HYDROcodone-acetaminophen (NORCO) 7.5-325 MG per tablet Take 1 tablet by mouth every 4 (four) hours as needed for pain. Patient not taking: Reported on 12/04/2017 12/30/12   Danae Orleans, PA-C  polyethylene glycol (MIRALAX / GLYCOLAX) packet Take 17 g by mouth daily as needed. Patient not taking: Reported on 12/04/2017 12/30/12   Danae Orleans, PA-C  tiZANidine (ZANAFLEX) 4 MG capsule Take 1  capsule (4 mg total) by mouth 3 (three) times daily as needed for muscle spasms. Muscle spasms Patient not taking: Reported on 12/04/2017 12/30/12   Danae Orleans, PA-C    Family History No family history on file.  Social History Social History   Tobacco Use  . Smoking status: Never Smoker  . Smokeless tobacco: Never Used  Substance Use Topics  . Alcohol use: No  . Drug use: No     Allergies   Patient has no known allergies.   Review of Systems Review of Systems  Musculoskeletal: Positive for back pain.  Ten systems reviewed and are negative for acute change, except as noted in the HPI.    Physical Exam Updated Vital Signs BP (!) 165/99   Pulse 98   Temp 98.7 F (37.1 C) (Oral)   Resp 18   SpO2 97%   Physical Exam  Constitutional: He is oriented to person, place, and time. He appears well-developed and well-nourished. No distress.  Nontoxic appearing, pleasant, and in NAD  HENT:  Head: Normocephalic and atraumatic.  Eyes: Conjunctivae and EOM are normal. No scleral icterus.  Neck: Normal range of motion.  Cardiovascular: Normal rate, regular rhythm and intact distal pulses.  Pulmonary/Chest: Effort normal. No stridor. No respiratory distress. He has no wheezes.  Lungs CTAB. Respirations even and unlabored  Abdominal: Soft. He exhibits no distension. There is no tenderness. There is no guarding.  Soft, nontender abdomen.  Musculoskeletal: Normal range of motion.  No bony deformities, step-offs, crepitus to the lumbosacral midline  Neurological: He is alert and oriented to person, place, and time. He exhibits normal muscle tone. Coordination normal.  Speech goal oriented.  Patient answers questions appropriately and follows commands.  He ambulates with steady gait.  Skin: Skin is warm and dry. No rash noted. He is not diaphoretic. No erythema. No pallor.  Psychiatric: He has a normal mood and affect. His behavior is normal.  Nursing note and vitals  reviewed.    ED Treatments / Results  Labs (all labs ordered are listed, but only abnormal results are displayed) Labs Reviewed  COMPREHENSIVE METABOLIC PANEL - Abnormal; Notable for the following components:      Result Value   Glucose, Bld 112 (*)    ALT 12 (*)    Total Bilirubin 1.6 (*)    GFR calc non Af Amer 52 (*)    GFR calc Af Amer 60 (*)    All other components within normal limits  CBC - Abnormal; Notable for the following components:   RBC 4.00 (*)    Hemoglobin 12.8 (*)    HCT 38.3 (*)    Platelets 92 (*)    All other components within normal limits  URINALYSIS, ROUTINE W REFLEX MICROSCOPIC - Abnormal; Notable for the following components:   Hgb urine dipstick MODERATE (*)    Bacteria, UA RARE (*)  All other components within normal limits  LIPASE, BLOOD    EKG None  Radiology Dg Chest 2 View  Result Date: 12/04/2017 CLINICAL DATA:  Back pain, fever EXAM: CHEST - 2 VIEW COMPARISON:  12/21/2012 FINDINGS: Lungs are clear.  No pleural effusion or pneumothorax. The heart is normal in size. Degenerative changes of the visualized thoracolumbar spine. IMPRESSION: No evidence of acute cardiopulmonary disease. Electronically Signed   By: Julian Hy M.D.   On: 12/04/2017 23:10    Procedures Procedures (including critical care time)  Medications Ordered in ED Medications - No data to display   Initial Impression / Assessment and Plan / ED Course  I have reviewed the triage vital signs and the nursing notes.  Pertinent labs & imaging results that were available during my care of the patient were reviewed by me and considered in my medical decision making (see chart for details).     84 year old male presents to the emergency department for increased fatigue and anorexia.  He has a history of chronic back pain which waxes and wanes.  He has little complaints of back pain at this time, but wife was concerned that his back pain may be secondary to a urinary  tract infection, given his additional symptoms.  She reports temperature 100.3 F at home.  Patient is afebrile in the emergency department, pleasant.  Vital signs are stable.  Laboratory work-up is reassuring.  He had a chest x-ray today which does not show any evidence of pneumonia.  Urinalysis not consistent with UTI.  Unable to exclude viral etiology, though I believe the patient is stable for discharge and outpatient follow-up with his primary care doctor.  Return precautions discussed with wife and provided. Patient discharged in stable condition; patient and wife with no unaddressed concerns.   Vitals:   12/04/17 1810 12/04/17 2252 12/05/17 0042  BP: (!) 162/86 (!) 143/78 (!) 165/99  Pulse: 87 77 98  Resp: 18 16 18   Temp: 98.1 F (36.7 C) 98.7 F (37.1 C)   TempSrc: Oral Oral   SpO2: 96% 96% 97%    Final Clinical Impressions(s) / ED Diagnoses   Final diagnoses:  Malaise and fatigue    ED Discharge Orders    None       Antonietta Breach, PA-C 12/05/17 0410    Davonna Belling, MD 12/08/17 2240

## 2017-12-04 NOTE — ED Triage Notes (Addendum)
Patient BIB wife, hx dementia, c/o back pain. Wife reports "he hasn't been right today." Reports "I believe he has diarrhea and he had a fever at home." Afebrile in triage. Last dose of 1000mg  Tylenol at 1500. Ambulatory. Wife adds "his urine doesn't look good."

## 2017-12-05 NOTE — Discharge Instructions (Signed)
Your work-up in the emergency department today was reassuring.  It did not show evidence of pneumonia or a urinary tract infection.  You have not had a fever while in the ED.  We do recommend close follow-up with your primary care doctor later today.  Continue your daily medications.  Return to the ED for new or concerning symptoms.

## 2017-12-17 ENCOUNTER — Encounter: Payer: Self-pay | Admitting: Neurology

## 2017-12-17 ENCOUNTER — Other Ambulatory Visit: Payer: Self-pay

## 2017-12-17 ENCOUNTER — Ambulatory Visit: Payer: Medicare Other | Admitting: Neurology

## 2017-12-17 DIAGNOSIS — F0280 Dementia in other diseases classified elsewhere without behavioral disturbance: Secondary | ICD-10-CM | POA: Diagnosis not present

## 2017-12-17 DIAGNOSIS — G301 Alzheimer's disease with late onset: Secondary | ICD-10-CM | POA: Diagnosis not present

## 2017-12-17 DIAGNOSIS — G309 Alzheimer's disease, unspecified: Secondary | ICD-10-CM

## 2017-12-17 DIAGNOSIS — R441 Visual hallucinations: Secondary | ICD-10-CM

## 2017-12-17 HISTORY — DX: Visual hallucinations: R44.1

## 2017-12-17 HISTORY — DX: Dementia in other diseases classified elsewhere, unspecified severity, without behavioral disturbance, psychotic disturbance, mood disturbance, and anxiety: F02.80

## 2017-12-17 MED ORDER — MEMANTINE HCL 5 MG PO TABS: ORAL_TABLET | ORAL | 0 refills | Status: DC

## 2017-12-17 NOTE — Patient Instructions (Signed)
We will start Namenda for the memory and agitation.

## 2017-12-17 NOTE — Progress Notes (Signed)
Faxed printed/singed rx memantine 5mg  tablet titration to [facility] at [phone]. Received fax confirmation.

## 2017-12-17 NOTE — Progress Notes (Signed)
Reason for visit: Memory disturbance, hallucinations  Referring physician: Dr. Mare Ferrari is a 84 y.o. male  History of present illness:  [name] [name] is a 84 year old right-handed white male with a history of a progressive memory disturbance that has been present for at least 2-1/2 or 3 years.  The patient comes in today with his wife.  The patient has a significant level of dementia, he is not able to operate a motor vehicle, he requires assistance with keeping up with medications and appointments, he no longer does the finances.  He can dress himself but his wife has to lay the clothes out for him.  He can bathe himself independently.  He is unable to operate a TV remote or a telephone.  The patient is developing separation anxiety when his wife goes out to run errands.  He no longer wants to travel, he wants to stay in his own home.  The patient is hard of hearing, he may think that the doorbell is ringing and will have his wife check on it.  The patient has recently started having troubles with visual hallucinations, he may see people inside the house or outside of the house, he sometimes will carry on a conversation with his hallucinations.  The episodes tend to occur in the evening when it tends to get dark.  The patient is not on any medications for memory.  The patient recently has been placed on some Lexapro which has helped some of the agitation associated with the hallucinations.  The patient has not demonstrated any violent behavior.  Given this recent behavioral change, the patient is sent to this office for an evaluation.  Past Medical History:  Diagnosis Date  . Arthritis   . Complication of anesthesia    had confusion after surg/  "OK following spinal anesth"  . History of kidney stones   . Hyperlipemia   . Hypertension   . Hypothyroidism   . Nocturia   . Seasonal allergies   . Slow urinary stream     Past Surgical History:  Procedure Laterality Date  .  CATARACT EXTRACTION     2015, 2017  . CYSTOSCOPY  2006   for kidney stone  . TONSILLECTOMY    . TOTAL KNEE ARTHROPLASTY  11/19/2011   Procedure: TOTAL KNEE ARTHROPLASTY;  Surgeon: Mauri Pole, MD;  Location: [facility];  Service: Orthopedics;  Laterality: Left;  . TOTAL KNEE ARTHROPLASTY Right 12/29/2012   Procedure: RIGHT TOTAL KNEE ARTHROPLASTY;  Surgeon: Mauri Pole, MD;  Location: [facility];  Service: Orthopedics;  Laterality: Right;    History reviewed. No pertinent family history.  Social history:  reports that he has never smoked. He has never used smokeless tobacco. He reports that he does not drink alcohol or use drugs.  Medications:  Prior to Admission medications   Medication Sig Start Date End Date Taking? Authorizing Provider  acetaminophen (TYLENOL) 500 MG tablet Take 500 mg by mouth every 6 (six) hours as needed for mild pain.   Yes [provider]  fexofenadine (ALLEGRA) 180 MG tablet Take 180 mg by mouth daily.   Yes [provider]  levothyroxine (SYNTHROID, LEVOTHROID) 75 MCG tablet Take 75 mcg by mouth daily before breakfast.    Yes [provider]  simvastatin (ZOCOR) 20 MG tablet Take 20 mg by mouth every evening.   Yes [provider]  memantine (NAMENDA) 5 MG tablet Take 1 tablet daily for one week, then take  1 tablet twice daily for one week, then take 1 tablet in the morning and 2 in the evening for one week, then take 2 tablets twice daily 12/17/17   Kathrynn Ducking, MD     No Known Allergies  ROS:  Out of a complete 14 system review of symptoms, the patient complains only of the following symptoms, and all other reviewed systems are negative.  Hearing loss Moles Joint pain Memory loss, confusion Disinterest in activities, hallucinations  Blood pressure (!) 141/80, pulse 82, height 5\' 6"  (1.676 m), weight 186 lb 8 oz (84.6 kg).  Physical Exam  General: The patient is alert and cooperative at the time of the  examination.  Eyes: Pupils are equal, round, and reactive to light. Discs are flat bilaterally.  Neck: The neck is supple, no carotid bruits are noted.  Respiratory: The respiratory examination is clear.  Cardiovascular: The cardiovascular examination reveals a regular rate and rhythm, no obvious murmurs or rubs are noted.  Skin: Extremities are without significant edema.  Neurologic Exam  Mental status: The patient is alert and oriented x 2 at the time of the examination (not oriented to date). The Mini-Mental status examination done today shows a total score of 16/30.  Cranial nerves: Facial symmetry is present. There is good sensation of the face to pinprick and soft touch bilaterally. The strength of the facial muscles and the muscles to head turning and shoulder shrug are normal bilaterally. Speech is well enunciated, no aphasia or dysarthria is noted. Extraocular movements are full. Visual fields are full. The tongue is midline, and the patient has symmetric elevation of the soft palate.  The patient appears to be hard of hearing.  Motor: The motor testing reveals 5 over 5 strength of all 4 extremities. Good symmetric motor tone is noted throughout.  Sensory: Sensory testing is intact to pinprick, soft touch, vibration sensation, and position sense on all 4 extremities. No evidence of extinction is noted.  Coordination: Cerebellar testing reveals good finger-nose-finger and heel-to-shin bilaterally.  Gait and station: Gait is normal. Romberg is negative. No drift is seen.  Reflexes: Deep tendon reflexes are symmetric and normal bilaterally. Toes are downgoing bilaterally.   Assessment/Plan:  1.  Memory disturbance, probable Alzheimer's disease  2.  Visual hallucinations  The visual hallucinations are quite common with any dementing illness.  The patient has a moderate level of dementia currently.  He is having some problems with agitation at times.  We will add Namenda for  the memory but also to help with agitation.  The Lexapro is also been helpful.  If the visual hallucinations become a significant issue with agitation, and antipsychotic medication such as Zyprexa or Abilify may need to be added in the future.  He will follow-up in about 6 months.  [name] Alexanders MD 12/17/2017 3:11 PM  [facility] [address] [place], [place] [address]  Phone [phone] Fax [phone]

## 2018-01-12 ENCOUNTER — Telehealth: Payer: Self-pay | Admitting: Neurology

## 2018-01-12 MED ORDER — MEMANTINE HCL 10 MG PO TABS: 10.0000 mg | ORAL_TABLET | Freq: Two times a day (BID) | ORAL | 1 refills | Status: DC

## 2018-01-12 NOTE — Telephone Encounter (Signed)
Received VO from Dr. Felecia Shelling to send in maintenance dose memnatine 10mg  BID

## 2018-01-12 NOTE — Telephone Encounter (Signed)
E-scribed rx to [facility] as requested.

## 2018-01-12 NOTE — Telephone Encounter (Signed)
Pt's wife [name]/DPR said pt has been taking memantine (NAMENDA) 5 MG tablet 10mg  twice daily since last Thursday and is tolerating it well. She would like 10mg  rx sent to [facility]. She said he will be out of the medication on Wednesday.

## 2018-05-22 ENCOUNTER — Encounter (HOSPITAL_COMMUNITY): Payer: Self-pay | Admitting: Nurse Practitioner

## 2018-05-22 ENCOUNTER — Emergency Department (HOSPITAL_COMMUNITY)
Admission: EM | Admit: 2018-05-22 | Discharge: 2018-05-23 | Disposition: A | Discharge status: Home / Self Care | Payer: Medicare Other | Attending: Emergency Medicine | Admitting: Emergency Medicine

## 2018-05-22 DIAGNOSIS — F0390 Unspecified dementia without behavioral disturbance: Secondary | ICD-10-CM | POA: Diagnosis not present

## 2018-05-22 DIAGNOSIS — N390 Urinary tract infection, site not specified: Secondary | ICD-10-CM | POA: Diagnosis present

## 2018-05-22 DIAGNOSIS — E039 Hypothyroidism, unspecified: Secondary | ICD-10-CM | POA: Diagnosis not present

## 2018-05-22 DIAGNOSIS — R531 Weakness: Secondary | ICD-10-CM | POA: Diagnosis not present

## 2018-05-22 DIAGNOSIS — I10 Essential (primary) hypertension: Secondary | ICD-10-CM | POA: Diagnosis not present

## 2018-05-22 DIAGNOSIS — Z87442 Personal history of urinary calculi: Secondary | ICD-10-CM | POA: Insufficient documentation

## 2018-05-22 DIAGNOSIS — Z79899 Other long term (current) drug therapy: Secondary | ICD-10-CM | POA: Insufficient documentation

## 2018-05-22 LAB — BASIC METABOLIC PANEL
Anion gap: 8 (ref 5–15)
BUN: 15 mg/dL (ref 8–23)
CHLORIDE: 108 mmol/L (ref 98–111)
CO2: 21 mmol/L — ABNORMAL LOW (ref 22–32)
CREATININE: 1.03 mg/dL (ref 0.61–1.24)
Calcium: 8.8 mg/dL — ABNORMAL LOW (ref 8.9–10.3)
GFR calc Af Amer: >60 mL/min (ref >60)
Glucose, Bld: 135 mg/dL — ABNORMAL HIGH (ref 70–99)
Potassium: 4 mmol/L (ref 3.5–5.1)
SODIUM: 137 mmol/L (ref 135–145)

## 2018-05-22 LAB — CBC
HCT: 38.8 % — ABNORMAL LOW (ref 39.0–52.0)
HEMOGLOBIN: 12.3 g/dL — AB (ref 13.0–17.0)
MCH: 31.1 pg (ref 26.0–34.0)
MCHC: 31.7 g/dL (ref 30.0–36.0)
MCV: 98 fL (ref 80.0–100.0)
NRBC: 0 % (ref 0.0–0.2)
PLATELETS: 88 10*3/uL — AB (ref 150–400)
RBC: 3.96 MIL/uL — AB (ref 4.22–5.81)
RDW: 12.8 % (ref 11.5–15.5)
WBC: 7.6 10*3/uL (ref 4.0–10.5)

## 2018-05-22 MED ORDER — ACETAMINOPHEN 325 MG PO TABS
650.0000 mg | ORAL_TABLET | Freq: Once | ORAL | Status: AC
  Administered 2018-05-22: 650 mg via ORAL
  Filled 2018-05-22: qty 2

## 2018-05-22 NOTE — ED Triage Notes (Signed)
Pt is presented by EMS for evaluation with c/o UTI. Reports that pt wife took him to PCP where he was seen and put and abx for UTI but unable to give him the at home antibiotic. Pt unable to offer a relaible hx, ems report wife is en route.

## 2018-05-22 NOTE — ED Provider Notes (Signed)
6:58 AM Assumed care from Dr. Ashok Cordia, please see their note for full history, physical and decision making until this point. In brief this is a 85 y.o. year old male who presented to the ED tonight with Urinary Tract Infection     UTI with decreased appetite.  Pending labs and repeat urinalysis but is already on antibiotics and tolerating p.o. here.  Likely discharge.  Reevaluation the patient's wife is present and states he has had some weakness and difficulty ambulating today. Not taking medication today. Not eating or drinking normally. These are abnormal for him. Will give IM Rocephin, bolus. Has been drinking fluids here. Will attempt ambulation prior to discharge to ensure safe.   Discharge instructions, including strict return precautions for new or worsening symptoms, given. Patient and/or family verbalized understanding and agreement with the plan as described.   Labs, studies and imaging reviewed by myself and considered in medical decision making if ordered. Imaging interpreted by radiology.  Labs Reviewed  BASIC METABOLIC PANEL - Abnormal; Notable for the following components:      Result Value   CO2 21 (*)    Glucose, Bld 135 (*)    Calcium 8.8 (*)    All other components within normal limits  CBC - Abnormal; Notable for the following components:   RBC 3.96 (*)    Hemoglobin 12.3 (*)    HCT 38.8 (*)    Platelets 88 (*)    All other components within normal limits  URINALYSIS, ROUTINE W REFLEX MICROSCOPIC - Abnormal; Notable for the following components:   Hgb urine dipstick MODERATE (*)    Leukocytes, UA SMALL (*)    All other components within normal limits  URINE CULTURE    No orders to display    No follow-ups on file.    Merrily Pew, MD 05/23/18 [phone]

## 2018-05-22 NOTE — ED Provider Notes (Signed)
[facility] DEPT Provider Note   CSN: [id number] Arrival date & time: 05/22/18  2106     History   Chief Complaint No chief complaint on file.   HPI [name] [name] is a 84 y.o. male.  Patient with history dementia, arrives via ems. Per report, pt with recent possible uti, and would not take antibiotic tonight. Pt with advanced dementia - very poor historian - level 5 caveat. Pt denies any complaint, states he feels fine. Denies pain. Denies dysuria. Denies fever.   The history is provided by the patient and the EMS personnel. The history is limited by the condition of the patient.    Past Medical History:  Diagnosis Date  . Alzheimer disease 12/17/2017  . Arthritis   . Complication of anesthesia    had confusion after surg/  "OK following spinal anesth"  . History of kidney stones   . Hyperlipemia   . Hypertension   . Hypothyroidism   . Nocturia   . Seasonal allergies   . Slow urinary stream   . Visual hallucinations 12/17/2017    Patient Active Problem List   Diagnosis Date Noted  . Alzheimer disease ([place]) 12/17/2017  . Visual hallucinations 12/17/2017  . Expected blood loss anemia 12/30/2012  . Overweight (BMI 25.0-29.9) 12/30/2012  . S/P right TKA 12/29/2012  . COLONIC POLYPS 09/03/2007  . HYPERLIPIDEMIA 09/03/2007  . HYPERTENSION 09/03/2007  . ALLERGIC RHINITIS 09/03/2007  . DIVERTICULOSIS OF COLON 09/03/2007  . RENAL CALCULUS 09/03/2007  . ARTHRITIS 09/03/2007    Past Surgical History:  Procedure Laterality Date  . CATARACT EXTRACTION     2015, 2017  . CYSTOSCOPY  2006   for kidney stone  . TONSILLECTOMY    . TOTAL KNEE ARTHROPLASTY  11/19/2011   Procedure: TOTAL KNEE ARTHROPLASTY;  Surgeon: Mauri Pole, MD;  Location: [facility];  Service: Orthopedics;  Laterality: Left;  . TOTAL KNEE ARTHROPLASTY Right 12/29/2012   Procedure: RIGHT TOTAL KNEE ARTHROPLASTY;  Surgeon: Mauri Pole, MD;  Location: [facility];  Service:  Orthopedics;  Laterality: Right;        Home Medications    Prior to Admission medications   Medication Sig Start Date End Date Taking? Authorizing Provider  acetaminophen (TYLENOL) 500 MG tablet Take 500 mg by mouth every 6 (six) hours as needed for mild pain.    [provider]  fexofenadine (ALLEGRA) 180 MG tablet Take 180 mg by mouth daily.    [provider]  levothyroxine (SYNTHROID, LEVOTHROID) 75 MCG tablet Take 75 mcg by mouth daily before breakfast.     [provider]  memantine (NAMENDA) 10 MG tablet Take 1 tablet (10 mg total) by mouth 2 (two) times daily. 01/12/18   Sater, Nanine Means, MD  simvastatin (ZOCOR) 20 MG tablet Take 20 mg by mouth every evening.    [provider]    Family History No family history on file.  Social History Social History   Tobacco Use  . Smoking status: Never Smoker  . Smokeless tobacco: Never Used  Substance Use Topics  . Alcohol use: No  . Drug use: No     Allergies   Patient has no known allergies.   Review of Systems Review of Systems  Unable to perform ROS: Dementia  Constitutional: Negative for fever.  Gastrointestinal: Negative for abdominal pain and vomiting.  Genitourinary: Negative for dysuria.  level 5 caveat - dementia   Physical Exam Updated Vital Signs BP (!) 144/86 (BP  Location: Left Arm)   Pulse (!) 103   Temp 98.5 F (36.9 C) (Oral)   Resp 18   SpO2 98%   Physical Exam  Constitutional: He appears well-developed and well-nourished.  HENT:  Mouth/Throat: Oropharynx is clear and moist.  Eyes: Conjunctivae are normal.  Neck: Neck supple. No tracheal deviation present.  No stiffness or rigidity.   Cardiovascular: Normal rate, regular rhythm, normal heart sounds and intact distal pulses.  Pulmonary/Chest: Effort normal and breath sounds normal. No accessory muscle usage. No respiratory distress.  Abdominal: Soft. Bowel sounds are normal. He exhibits no distension. There  is no tenderness.  Genitourinary:  Genitourinary Comments: No cva tenderness. Normal external gu exam.   Musculoskeletal: He exhibits no edema.  Neurological: He is alert.  Skin: Skin is warm and dry. No rash noted.  Psychiatric: He has a normal mood and affect.  Nursing note and vitals reviewed.    ED Treatments / Results  Labs (all labs ordered are listed, but only abnormal results are displayed) Results for orders placed or performed during the hospital encounter of 46/96/29  Basic metabolic panel  Result Value Ref Range   Sodium 137 135 - 145 mmol/L   Potassium 4.0 3.5 - 5.1 mmol/L   Chloride 108 98 - 111 mmol/L   CO2 21 (L) 22 - 32 mmol/L   Glucose, Bld 135 (H) 70 - 99 mg/dL   BUN 15 8 - 23 mg/dL   Creatinine, Ser 1.03 0.61 - 1.24 mg/dL   Calcium 8.8 (L) 8.9 - 10.3 mg/dL   GFR calc non Af Amer >60 >60 mL/min   GFR calc Af Amer >60 >60 mL/min   Anion gap 8 5 - 15   EKG None  Radiology No results found.  Procedures Procedures (including critical care time)  Medications Ordered in ED Medications - No data to display   Initial Impression / Assessment and Plan / ED Course  I have reviewed the triage vital signs and the nursing notes.  Pertinent labs & imaging results that were available during my care of the patient were reviewed by me and considered in my medical decision making (see chart for details).  Labs sent.  Reviewed nursing notes and prior charts for additional history. Of note, very similar presentation 11/2017 - no acute process noted then.   Labs reviewed - chem unremarkable, ua pending.  Pt is eating/drinking, no distress. Afebrile.   Signed out to Dr Dolly Rias to check CBC/UA, dispo appropriately.     Final Clinical Impressions(s) / ED Diagnoses   Final diagnoses:  None    ED Discharge Orders    None       Lajean Saver, MD 05/22/18 2248

## 2018-05-22 NOTE — ED Notes (Signed)
Bed: RJ73 Expected date:  Expected time:  Means of arrival:  Comments: EMS 84 yo male confused-recent UTI

## 2018-05-23 LAB — URINALYSIS, ROUTINE W REFLEX MICROSCOPIC
BACTERIA UA: NONE SEEN
Bilirubin Urine: NEGATIVE
Glucose, UA: NEGATIVE mg/dL
Ketones, ur: NEGATIVE mg/dL
Nitrite: NEGATIVE
PROTEIN: NEGATIVE mg/dL
Specific Gravity, Urine: 1.013 (ref 1.005–1.030)
pH: 5 (ref 5.0–8.0)

## 2018-05-23 MED ORDER — CEFTRIAXONE SODIUM 1 G IJ SOLR
1.0000 g | Freq: Once | INTRAMUSCULAR | Status: AC
  Administered 2018-05-23: 1 g via INTRAMUSCULAR
  Filled 2018-05-23: qty 10

## 2018-05-23 MED ORDER — LIDOCAINE HCL 1 % IJ SOLN
INTRAMUSCULAR | Status: AC
  Administered 2018-05-23: 1.5 mL
  Filled 2018-05-23: qty 20

## 2018-05-23 MED ORDER — NITROFURANTOIN MONOHYD MACRO 100 MG PO CAPS
100.0000 mg | ORAL_CAPSULE | Freq: Once | ORAL | Status: AC
  Administered 2018-05-23: 100 mg via ORAL
  Filled 2018-05-23: qty 1

## 2018-05-23 MED ORDER — LACTATED RINGERS IV BOLUS
1000.0000 mL | Freq: Once | INTRAVENOUS | Status: AC
  Administered 2018-05-23: 1000 mL via INTRAVENOUS

## 2018-06-07 DIAGNOSIS — I4891 Unspecified atrial fibrillation: Secondary | ICD-10-CM

## 2018-06-07 HISTORY — DX: Unspecified atrial fibrillation: I48.91

## 2018-06-12 ENCOUNTER — Encounter (HOSPITAL_COMMUNITY): Payer: Self-pay

## 2018-06-12 ENCOUNTER — Ambulatory Visit (HOSPITAL_COMMUNITY)
Admission: EM | Admit: 2018-06-12 | Discharge: 2018-06-12 | Disposition: A | Discharge status: Other Acute Inpatient Hospital | Payer: Medicare Other | Source: Home / Self Care

## 2018-06-12 ENCOUNTER — Other Ambulatory Visit: Payer: Self-pay

## 2018-06-12 ENCOUNTER — Inpatient Hospital Stay (HOSPITAL_COMMUNITY)
Admission: EM | Admit: 2018-06-12 | Discharge: 2018-06-15 | DRG: 309 | Disposition: A | Discharge status: Home Health | Payer: Medicare Other | Attending: Internal Medicine | Admitting: Internal Medicine

## 2018-06-12 ENCOUNTER — Encounter (HOSPITAL_COMMUNITY): Payer: Self-pay | Admitting: Emergency Medicine

## 2018-06-12 DIAGNOSIS — E7849 Other hyperlipidemia: Secondary | ICD-10-CM | POA: Diagnosis not present

## 2018-06-12 DIAGNOSIS — R531 Weakness: Secondary | ICD-10-CM

## 2018-06-12 DIAGNOSIS — Z79899 Other long term (current) drug therapy: Secondary | ICD-10-CM | POA: Diagnosis not present

## 2018-06-12 DIAGNOSIS — I10 Essential (primary) hypertension: Secondary | ICD-10-CM | POA: Diagnosis present

## 2018-06-12 DIAGNOSIS — I4891 Unspecified atrial fibrillation: Principal | ICD-10-CM | POA: Diagnosis present

## 2018-06-12 DIAGNOSIS — E785 Hyperlipidemia, unspecified: Secondary | ICD-10-CM | POA: Diagnosis present

## 2018-06-12 DIAGNOSIS — N183 Chronic kidney disease, stage 3 unspecified: Secondary | ICD-10-CM | POA: Diagnosis present

## 2018-06-12 DIAGNOSIS — Z515 Encounter for palliative care: Secondary | ICD-10-CM | POA: Diagnosis not present

## 2018-06-12 DIAGNOSIS — R5383 Other fatigue: Secondary | ICD-10-CM

## 2018-06-12 DIAGNOSIS — F0281 Dementia in other diseases classified elsewhere with behavioral disturbance: Secondary | ICD-10-CM | POA: Diagnosis present

## 2018-06-12 DIAGNOSIS — I129 Hypertensive chronic kidney disease with stage 1 through stage 4 chronic kidney disease, or unspecified chronic kidney disease: Secondary | ICD-10-CM | POA: Diagnosis present

## 2018-06-12 DIAGNOSIS — N390 Urinary tract infection, site not specified: Secondary | ICD-10-CM | POA: Diagnosis present

## 2018-06-12 DIAGNOSIS — Z96653 Presence of artificial knee joint, bilateral: Secondary | ICD-10-CM | POA: Diagnosis present

## 2018-06-12 DIAGNOSIS — G309 Alzheimer's disease, unspecified: Secondary | ICD-10-CM | POA: Diagnosis present

## 2018-06-12 DIAGNOSIS — F0280 Dementia in other diseases classified elsewhere without behavioral disturbance: Secondary | ICD-10-CM | POA: Diagnosis present

## 2018-06-12 DIAGNOSIS — G301 Alzheimer's disease with late onset: Secondary | ICD-10-CM | POA: Diagnosis not present

## 2018-06-12 DIAGNOSIS — Z7189 Other specified counseling: Secondary | ICD-10-CM | POA: Diagnosis not present

## 2018-06-12 DIAGNOSIS — IMO0001 Reserved for inherently not codable concepts without codable children: Secondary | ICD-10-CM

## 2018-06-12 DIAGNOSIS — F05 Delirium due to known physiological condition: Secondary | ICD-10-CM | POA: Diagnosis present

## 2018-06-12 DIAGNOSIS — Z0389 Encounter for observation for other suspected diseases and conditions ruled out: Secondary | ICD-10-CM

## 2018-06-12 DIAGNOSIS — E039 Hypothyroidism, unspecified: Secondary | ICD-10-CM | POA: Diagnosis present

## 2018-06-12 DIAGNOSIS — I340 Nonrheumatic mitral (valve) insufficiency: Secondary | ICD-10-CM | POA: Diagnosis not present

## 2018-06-12 DIAGNOSIS — Z66 Do not resuscitate: Secondary | ICD-10-CM | POA: Diagnosis not present

## 2018-06-12 DIAGNOSIS — Z79890 Hormone replacement therapy (postmenopausal): Secondary | ICD-10-CM | POA: Diagnosis not present

## 2018-06-12 DIAGNOSIS — I361 Nonrheumatic tricuspid (valve) insufficiency: Secondary | ICD-10-CM | POA: Diagnosis not present

## 2018-06-12 HISTORY — DX: Unspecified atrial fibrillation: I48.91

## 2018-06-12 HISTORY — DX: Encounter for observation for other suspected diseases and conditions ruled out: Z03.89

## 2018-06-12 LAB — COMPREHENSIVE METABOLIC PANEL
ALT: 12 U/L (ref 0–44)
AST: 19 U/L (ref 15–41)
Albumin: 3.7 g/dL (ref 3.5–5.0)
Alkaline Phosphatase: 54 U/L (ref 38–126)
Anion gap: 9 (ref 5–15)
BUN: 13 mg/dL (ref 8–23)
CO2: 23 mmol/L (ref 22–32)
Calcium: 9.6 mg/dL (ref 8.9–10.3)
Chloride: 108 mmol/L (ref 98–111)
Creatinine, Ser: 1.39 mg/dL — ABNORMAL HIGH (ref 0.61–1.24)
GFR calc Af Amer: 50 mL/min — ABNORMAL LOW (ref >60)
GFR calc non Af Amer: 43 mL/min — ABNORMAL LOW (ref >60)
Glucose, Bld: 113 mg/dL — ABNORMAL HIGH (ref 70–99)
Potassium: 4.9 mmol/L (ref 3.5–5.1)
Sodium: 140 mmol/L (ref 135–145)
Total Bilirubin: 0.6 mg/dL (ref 0.3–1.2)
Total Protein: 7.5 g/dL (ref 6.5–8.1)

## 2018-06-12 LAB — URINALYSIS, ROUTINE W REFLEX MICROSCOPIC
Bacteria, UA: NONE SEEN
Bilirubin Urine: NEGATIVE
Glucose, UA: NEGATIVE mg/dL
Hgb urine dipstick: NEGATIVE
Ketones, ur: NEGATIVE mg/dL
Nitrite: NEGATIVE
Protein, ur: NEGATIVE mg/dL
SPECIFIC GRAVITY, URINE: 1.014 (ref 1.005–1.030)
pH: 5 (ref 5.0–8.0)

## 2018-06-12 LAB — CBC WITH DIFFERENTIAL/PLATELET
ABS IMMATURE GRANULOCYTES: 0.07 10*3/uL (ref 0.00–0.07)
BASOS PCT: 0 %
Basophils Absolute: 0 10*3/uL (ref 0.0–0.1)
Eosinophils Absolute: 0.1 10*3/uL (ref 0.0–0.5)
Eosinophils Relative: 2 %
HCT: 42.3 % (ref 39.0–52.0)
Hemoglobin: 13.3 g/dL (ref 13.0–17.0)
Immature Granulocytes: 1 %
Lymphocytes Relative: 28 %
Lymphs Abs: 1.4 10*3/uL (ref 0.7–4.0)
MCH: 31 pg (ref 26.0–34.0)
MCHC: 31.4 g/dL (ref 30.0–36.0)
MCV: 98.6 fL (ref 80.0–100.0)
MONO ABS: 0.6 10*3/uL (ref 0.1–1.0)
MONOS PCT: 11 %
NEUTROS ABS: 2.8 10*3/uL (ref 1.7–7.7)
Neutrophils Relative %: 58 %
PLATELETS: 134 10*3/uL — AB (ref 150–400)
RBC: 4.29 MIL/uL (ref 4.22–5.81)
RDW: 12.6 % (ref 11.5–15.5)
WBC: 4.8 10*3/uL (ref 4.0–10.5)
nRBC: 0 % (ref 0.0–0.2)

## 2018-06-12 LAB — TSH: TSH: 3.925 u[IU]/mL (ref 0.350–4.500)

## 2018-06-12 LAB — TROPONIN I: Troponin I: 0.04 ng/mL (ref <0.03)

## 2018-06-12 LAB — I-STAT TROPONIN, ED: TROPONIN I, POC: 0.02 ng/mL (ref 0.00–0.08)

## 2018-06-12 LAB — T4, FREE: FREE T4: 0.92 ng/dL (ref 0.82–1.77)

## 2018-06-12 MED ORDER — ASPIRIN EC 81 MG PO TBEC
162.0000 mg | DELAYED_RELEASE_TABLET | Freq: Every day | ORAL | Status: DC
  Administered 2018-06-12 – 2018-06-13 (×2): 162 mg via ORAL
  Filled 2018-06-12 (×2): qty 2

## 2018-06-12 MED ORDER — LORATADINE 10 MG PO TABS: 10.0000 mg | ORAL_TABLET | Freq: Every day | ORAL | Status: DC

## 2018-06-12 MED ORDER — SODIUM CHLORIDE 0.9% FLUSH: 3.0000 mL | INTRAVENOUS | Status: DC | PRN

## 2018-06-12 MED ORDER — MEMANTINE HCL 5 MG PO TABS
10.0000 mg | ORAL_TABLET | Freq: Two times a day (BID) | ORAL | Status: DC
  Administered 2018-06-12 – 2018-06-15 (×6): 10 mg via ORAL
  Filled 2018-06-12 (×6): qty 2

## 2018-06-12 MED ORDER — SODIUM CHLORIDE 0.9 % IV SOLN: 250.0000 mL | INTRAVENOUS | Status: DC | PRN

## 2018-06-12 MED ORDER — DILTIAZEM HCL-DEXTROSE 100-5 MG/100ML-% IV SOLN (PREMIX)
10.0000 mg/h | INTRAVENOUS | Status: DC
  Administered 2018-06-12 – 2018-06-13 (×3): 10 mg/h via INTRAVENOUS
  Filled 2018-06-12 (×3): qty 100

## 2018-06-12 MED ORDER — NITROFURANTOIN MONOHYD MACRO 100 MG PO CAPS: 100.0000 mg | ORAL_CAPSULE | Freq: Two times a day (BID) | ORAL | Status: DC

## 2018-06-12 MED ORDER — ONDANSETRON HCL 4 MG/2ML IJ SOLN: 4.0000 mg | Freq: Four times a day (QID) | INTRAMUSCULAR | Status: DC | PRN

## 2018-06-12 MED ORDER — FLUTICASONE PROPIONATE 50 MCG/ACT NA SUSP: 2.0000 | Freq: Every day | NASAL | Status: DC | PRN

## 2018-06-12 MED ORDER — ACETAMINOPHEN 500 MG PO TABS
1000.0000 mg | ORAL_TABLET | Freq: Three times a day (TID) | ORAL | Status: DC
  Administered 2018-06-12 – 2018-06-15 (×9): 1000 mg via ORAL
  Filled 2018-06-12 (×9): qty 2

## 2018-06-12 MED ORDER — SODIUM CHLORIDE 0.9% FLUSH
3.0000 mL | Freq: Two times a day (BID) | INTRAVENOUS | Status: DC
  Administered 2018-06-12 – 2018-06-15 (×5): 3 mL via INTRAVENOUS

## 2018-06-12 MED ORDER — SIMVASTATIN 20 MG PO TABS: 20.0000 mg | ORAL_TABLET | Freq: Every evening | ORAL | Status: DC

## 2018-06-12 MED ORDER — ENOXAPARIN SODIUM 30 MG/0.3ML ~~LOC~~ SOLN
30.0000 mg | SUBCUTANEOUS | Status: DC
  Administered 2018-06-12: 30 mg via SUBCUTANEOUS
  Filled 2018-06-12: qty 0.3

## 2018-06-12 MED ORDER — LEVOTHYROXINE SODIUM 75 MCG PO TABS
75.0000 ug | ORAL_TABLET | Freq: Every day | ORAL | Status: DC
  Administered 2018-06-13 – 2018-06-15 (×3): 75 ug via ORAL
  Filled 2018-06-12 (×3): qty 1

## 2018-06-12 MED ORDER — ONDANSETRON HCL 4 MG PO TABS: 4.0000 mg | ORAL_TABLET | Freq: Four times a day (QID) | ORAL | Status: DC | PRN

## 2018-06-12 NOTE — Progress Notes (Signed)
Pt resting in bed. Lab value of a trop .04 called. Md on call paged. Pt denies any Cp. Will cont to monitor pt.

## 2018-06-12 NOTE — H&P (Signed)
[facility]                                                                                    Patient Demographics  [name] [name], is a 84 y.o. male  CSN: [id number]  MRN: [id number]  DOB - 11-Jul-1922  Admit Date - 06/12/2018  Outpatient Primary MD for the patient is Tisovec, Fransico Him, MD   With History of -  Past Medical History:  Diagnosis Date  . Alzheimer disease ([place]) 12/17/2017  . Arthritis   . Complication of anesthesia    had confusion after surg/  "OK following spinal anesth"  . History of kidney stones   . Hyperlipemia   . Hypertension   . Hypothyroidism   . Nocturia   . Seasonal allergies   . Slow urinary stream   . Visual hallucinations 12/17/2017      Past Surgical History:  Procedure Laterality Date  . CATARACT EXTRACTION     2015, 2017  . CYSTOSCOPY  2006   for kidney stone  . TONSILLECTOMY    . TOTAL KNEE ARTHROPLASTY  11/19/2011   Procedure: TOTAL KNEE ARTHROPLASTY;  Surgeon: Mauri Pole, MD;  Location: [facility];  Service: Orthopedics;  Laterality: Left;  . TOTAL KNEE ARTHROPLASTY Right 12/29/2012   Procedure: RIGHT TOTAL KNEE ARTHROPLASTY;  Surgeon: Mauri Pole, MD;  Location: [facility];  Service: Orthopedics;  Laterality: Right;    in for   Chief Complaint  Patient presents with  . Atrial Fibrillation     HPI  [name] [name]  is a 84 y.o. male, with past medical history significant for hypertension, hyperlipidemia, hypothyroidism and advanced Alzheimer's dementia presenting today for evaluation of new onset A. fib.  Patient had a diagnosed urinary tract infection around 3 weeks ago and he has been having home health to help with his needs and medications.  Today home health nurse noticed that he has an abnormal rhythm and he was sent to the emergency room to be diagnosed with A. fib with rapid ventricular rate at the rate of 120 bpm.  Patient was started on Cardizem drip.  No previous history of A. fib.  Cardiology has been  consulted.    Review of Systems    In addition to the HPI above,  No Fever-chills, No Headache, No changes with Vision or hearing, No problems swallowing food or Liquids, No Chest pain, Cough or Shortness of Breath, No Abdominal pain, No Nausea or Vommitting, Bowel movements are regular, No Blood in stool or Urine, No dysuria, No new skin rashes or bruises, No new joints pains-aches,  No new weakness, tingling, numbness in any extremity, No recent weight gain or loss, No polyuria, polydypsia or polyphagia, No significant Mental Stressors.  A full 10 point Review of Systems was done, except as stated above, all other Review of Systems were negative.   Social History Social History   Tobacco Use  . Smoking status: Never Smoker  . Smokeless tobacco: Never Used  Substance Use Topics  . Alcohol use: No     Family History No family history on file.   Prior to Admission medications   Medication Sig Start  Date End Date Taking? Authorizing Provider  acetaminophen (TYLENOL) 500 MG tablet Take 1,000 mg by mouth 3 (three) times daily.     [provider]  fexofenadine (ALLEGRA) 180 MG tablet Take 180 mg by mouth daily.    [provider]  fluticasone (FLONASE) 50 MCG/ACT nasal spray Place 2 sprays into both nostrils daily.  05/21/18   [provider]  levothyroxine (SYNTHROID, LEVOTHROID) 75 MCG tablet Take 75 mcg by mouth daily before breakfast.     [provider]  memantine (NAMENDA) 10 MG tablet Take 1 tablet (10 mg total) by mouth 2 (two) times daily. 01/12/18   Sater, Nanine Means, MD  nitrofurantoin, macrocrystal-monohydrate, (MACROBID) 100 MG capsule Take 100 mg by mouth 2 (two) times daily.  05/21/18   [provider]  simvastatin (ZOCOR) 20 MG tablet Take 20 mg by mouth every evening.    [provider]    No Known Allergies  Physical Exam  Vitals  Blood pressure 120/82, pulse 74, temperature 97.7 F (36.5 C),  temperature source Oral, resp. rate 14, height 5\' 6"  (1.676 m), weight 86.2 kg, SpO2 93 %.   1. General well-developed male, very pleasant, no acute distress  2.  Abnormal affect and insight, Not Suicidal or Homicidal, pleasantly confused.  3. No F.N deficits, grossly, patient moving all extremities.  4. Ears and Eyes appear Normal, Conjunctivae clear, PERRLA. Moist Oral Mucosa.  5. Supple Neck, No JVD, No cervical lymphadenopathy appriciated, No Carotid Bruits.  6. Symmetrical Chest wall movement, Good air movement bilaterally, CTAB.  7.  Irregularly irregular, No Gallops, Rubs or Murmurs, No Parasternal Heave.  8. Positive Bowel Sounds, Abdomen Soft, Non tender,.  9.  No Cyanosis, Normal Skin Turgor, No Skin Rash or Bruise.  10. Good muscle tone,  joints appear normal , +1 lower extremity edema.    Data Review  CBC Recent Labs  Lab 06/12/18 1337  WBC 4.8  HGB 13.3  HCT 42.3  PLT 134*  MCV 98.6  MCH 31.0  MCHC 31.4  RDW 12.6  LYMPHSABS 1.4  MONOABS 0.6  EOSABS 0.1  BASOSABS 0.0   ------------------------------------------------------------------------------------------------------------------  Chemistries  Recent Labs  Lab 06/12/18 1337  NA 140  K 4.9  CL 108  CO2 23  GLUCOSE 113*  BUN 13  CREATININE 1.39*  CALCIUM 9.6  AST 19  ALT 12  ALKPHOS 54  BILITOT 0.6   ------------------------------------------------------------------------------------------------------------------ estimated creatinine clearance is 34.2 mL/min (A) (by C-G formula based on SCr of 1.39 mg/dL (H)). ------------------------------------------------------------------------------------------------------------------ Recent Labs    06/12/18 1338  TSH 3.925     Coagulation profile No results for input(s): INR, PROTIME in the last 168 hours. ------------------------------------------------------------------------------------------------------------------- No results for  input(s): DDIMER in the last 72 hours. -------------------------------------------------------------------------------------------------------------------  Cardiac Enzymes No results for input(s): CKMB, TROPONINI, MYOGLOBIN in the last 168 hours.  Invalid input(s): CK ------------------------------------------------------------------------------------------------------------------ Invalid input(s): POCBNP   ---------------------------------------------------------------------------------------------------------------  Urinalysis    Component Value Date/Time   COLORURINE YELLOW 05/22/2018 2130   APPEARANCEUR CLEAR 05/22/2018 2130   LABSPEC 1.013 05/22/2018 2130   PHURINE 5.0 05/22/2018 2130   GLUCOSEU NEGATIVE 05/22/2018 2130   HGBUR MODERATE (A) 05/22/2018 2130   BILIRUBINUR NEGATIVE 05/22/2018 2130   [place] NEGATIVE 05/22/2018 2130   PROTEINUR NEGATIVE 05/22/2018 2130   UROBILINOGEN 0.2 12/21/2012 1045   NITRITE NEGATIVE 05/22/2018 2130   LEUKOCYTESUR SMALL (A) 05/22/2018 2130    ----------------------------------------------------------------------------------------------------------------   Imaging results:   No results found.  My personal review of  EKG:   Assessment & Plan  A. fib with RVR New onset A. Fib Continue with Cardizem drip started in the emergency room TSH was normal Echo is pending Cardiology consult I doubt that the patient will need anticoagulation?  Low in fib  Hyperlipidemia Continue statins  Hypertension; not on medications  Advanced dementia continue with Namenda  UTI continue with nitrofurantoin  Hypothyroidism continue with Synthroid  DVT Prophylaxis Lovenox  AM Labs Ordered, also please review Full Orders  Family Communication: Admission, patients condition and plan of care including tests being ordered have been discussed with the patient and wife who indicate understanding and agree with the plan and Code Status.  Code  Status full for now.  Family is considering DNR  Disposition Plan: To be determined  Time spent in minutes : 38 minutes  Condition GUARDED   @SIGNATURE @

## 2018-06-12 NOTE — ED Triage Notes (Signed)
Pt sent from doctors office to urgent care for new onset afib, pt then sent from urgent to ED for same. Pt does not have hx, pt wife states provider at urgent care was concerned for blood clots. Pt originally went to his PCP for a UTI. Pt denies chest pain, sob. Pt in NAD.

## 2018-06-12 NOTE — ED Notes (Signed)
Attempted to call report x 1  

## 2018-06-12 NOTE — ED Notes (Signed)
Pt to go to ED for further eval per KL

## 2018-06-12 NOTE — ED Provider Notes (Signed)
Patient complained of generalized weakness for the past few days.  Home health nurse noted him to have an irregular heartbeat this morning.  Sent here for further evaluation.  Patient denies complaint except for low back pain which is chronic.  A.m. chronically ill-appearing lungs clear to auscultation heart tachycardic irregularly irregular   Orlie Dakin, MD 06/12/18 1307

## 2018-06-12 NOTE — ED Provider Notes (Signed)
[facility] EMERGENCY DEPARTMENT Provider Note   CSN: [id number] Arrival date & time: 06/12/18  1128     History   Chief Complaint Chief Complaint  Patient presents with  . Atrial Fibrillation    HPI [name] [name] is a 84 y.o. male with a PMHx of alzheimer's, HTN, HLD, and other conditions listed below, who presents to the ED accompanied by his wife with complaints of being told that he was in A. fib by his home health nurse.  Patient's wife states that 3 weeks ago he had a UTI and ever since then he has been weaker than usual, he had home health coming out twice a week to check on him as well as PT.  Today his home health nurse came out and noticed that he was in A. fib.  He went to urgent care who confirmed that so they sent him here for further evaluation and management.  Patient's wife and the patient deny that he has started any new medications, although recently he stopped taking his simvastatin because he was having some leg pain/weakness and they thought it was from that medicine.  The patient was unaware that he was in A. fib, has not noticed any palpitations, chest pain, shortness of breath, or any other complaints regarding A. fib.  He also denies any recent fevers, URI symptoms, cough, abdominal pain, nausea, vomiting, diarrhea, constipation, dysuria, hematuria, numbness, tingling, focal weakness, leg swelling, or any other complaints at this time.  He denies any recent travel or surgery, no family or personal history of DVT/PE.  He was previously under the care of Dr. Acie Fredrickson of Cardiology, and they called his office today prior to coming in to be seen.   The history is provided by the patient, medical records and the spouse. No language interpreter was used.  Atrial Fibrillation  Pertinent negatives include no chest pain, no abdominal pain and no shortness of breath.    Past Medical History:  Diagnosis Date  . Alzheimer disease ([place]) 12/17/2017  . Arthritis    . Complication of anesthesia    had confusion after surg/  "OK following spinal anesth"  . History of kidney stones   . Hyperlipemia   . Hypertension   . Hypothyroidism   . Nocturia   . Seasonal allergies   . Slow urinary stream   . Visual hallucinations 12/17/2017    Patient Active Problem List   Diagnosis Date Noted  . Alzheimer disease ([place]) 12/17/2017  . Visual hallucinations 12/17/2017  . Expected blood loss anemia 12/30/2012  . Overweight (BMI 25.0-29.9) 12/30/2012  . S/P right TKA 12/29/2012  . COLONIC POLYPS 09/03/2007  . HYPERLIPIDEMIA 09/03/2007  . HYPERTENSION 09/03/2007  . ALLERGIC RHINITIS 09/03/2007  . DIVERTICULOSIS OF COLON 09/03/2007  . RENAL CALCULUS 09/03/2007  . ARTHRITIS 09/03/2007    Past Surgical History:  Procedure Laterality Date  . CATARACT EXTRACTION     2015, 2017  . CYSTOSCOPY  2006   for kidney stone  . TONSILLECTOMY    . TOTAL KNEE ARTHROPLASTY  11/19/2011   Procedure: TOTAL KNEE ARTHROPLASTY;  Surgeon: Mauri Pole, MD;  Location: [facility];  Service: Orthopedics;  Laterality: Left;  . TOTAL KNEE ARTHROPLASTY Right 12/29/2012   Procedure: RIGHT TOTAL KNEE ARTHROPLASTY;  Surgeon: Mauri Pole, MD;  Location: [facility];  Service: Orthopedics;  Laterality: Right;        Home Medications    Prior to Admission medications   Medication Sig Start Date  End Date Taking? Authorizing Provider  acetaminophen (TYLENOL) 500 MG tablet Take 1,000 mg by mouth 3 (three) times daily.     [provider]  fexofenadine (ALLEGRA) 180 MG tablet Take 180 mg by mouth daily.    [provider]  fluticasone (FLONASE) 50 MCG/ACT nasal spray Place 2 sprays into both nostrils daily.  05/21/18   [provider]  levothyroxine (SYNTHROID, LEVOTHROID) 75 MCG tablet Take 75 mcg by mouth daily before breakfast.     [provider]  memantine (NAMENDA) 10 MG tablet Take 1 tablet (10 mg total) by mouth 2 (two) times daily. 01/12/18    Sater, Nanine Means, MD  nitrofurantoin, macrocrystal-monohydrate, (MACROBID) 100 MG capsule Take 100 mg by mouth 2 (two) times daily.  05/21/18   [provider]  simvastatin (ZOCOR) 20 MG tablet Take 20 mg by mouth every evening.    [provider]    Family History No family history on file.  Social History Social History   Tobacco Use  . Smoking status: Never Smoker  . Smokeless tobacco: Never Used  Substance Use Topics  . Alcohol use: No  . Drug use: No     Allergies   Patient has no known allergies.   Review of Systems Review of Systems  Constitutional: Positive for fatigue. Negative for chills and fever.  HENT: Negative for congestion.   Respiratory: Negative for cough and shortness of breath.   Cardiovascular: Negative for chest pain, palpitations and leg swelling.  Gastrointestinal: Negative for abdominal pain, constipation, diarrhea, nausea and vomiting.  Genitourinary: Negative for dysuria and hematuria.  Musculoskeletal: Negative for arthralgias and myalgias.  Skin: Negative for color change.  Allergic/Immunologic: Negative for immunocompromised state.  Neurological: Negative for weakness and numbness.  Psychiatric/Behavioral: Negative for confusion.   All other systems reviewed and are negative for acute change except as noted in the HPI.    Physical Exam Updated Vital Signs BP 105/80   Pulse (!) 127   Temp 97.7 F (36.5 C) (Oral)   Resp 14   Ht 5\' 6"  (1.676 m)   Wt 86.2 kg   SpO2 98%   BMI 30.67 kg/m   Physical Exam  Constitutional: He is oriented to person, place, and time. Vital signs are normal. He appears well-developed and well-nourished.  Non-toxic appearance. No distress.  Afebrile, nontoxic, NAD  HENT:  Head: Normocephalic and atraumatic.  Mouth/Throat: Oropharynx is clear and moist and mucous membranes are normal.  Hard of hearing  Eyes: Conjunctivae and EOM are normal. Right eye exhibits no discharge. Left eye exhibits  no discharge.  Neck: Normal range of motion. Neck supple.  Cardiovascular: Normal heart sounds and intact distal pulses. An irregularly irregular rhythm present. Tachycardia present. Exam reveals no gallop and no friction rub.  No murmur heard. Irregularly irregular rhythm, rate in the 120s during exam, nl s1/s2, no m/r/g appreciated, distal pulses intact, no pedal edema   Pulmonary/Chest: Effort normal and breath sounds normal. No respiratory distress. He has no decreased breath sounds. He has no wheezes. He has no rhonchi. He has no rales.  Abdominal: Soft. Normal appearance and bowel sounds are normal. He exhibits no distension. There is no tenderness. There is no rigidity, no rebound, no guarding, no CVA tenderness, no tenderness at McBurney's point and negative Murphy's sign.  Musculoskeletal: Normal range of motion.  Neurological: He is alert and oriented to person, place, and time. He has normal strength. No sensory deficit.  Skin: Skin is warm, dry  and intact. No rash noted.  Psychiatric: He has a normal mood and affect.  Nursing note and vitals reviewed.    ED Treatments / Results  Labs (all labs ordered are listed, but only abnormal results are displayed) Labs Reviewed  CBC WITH DIFFERENTIAL/PLATELET - Abnormal; Notable for the following components:      Result Value   Platelets 134 (*)    All other components within normal limits  COMPREHENSIVE METABOLIC PANEL - Abnormal; Notable for the following components:   Glucose, Bld 113 (*)    Creatinine, Ser 1.39 (*)    GFR calc non Af Amer 43 (*)    GFR calc Af Amer 50 (*)    All other components within normal limits  TSH  T4, FREE  URINALYSIS, ROUTINE W REFLEX MICROSCOPIC  I-STAT TROPONIN, ED    EKG EKG Interpretation  Date/Time:  Friday June 12 2018 11:48:21 EST Ventricular Rate:  129 PR Interval:    QRS Duration: 86 QT Interval:  309 QTC Calculation: 453 R Axis:   48 Text Interpretation:  Atrial fibrillation No  significant change since last tracing Confirmed by Orlie Dakin [phone]) on 06/12/2018 1:06:45 PM EKG FROM 01/08/1999 SHOWED NSR   Radiology No results found.  Procedures Procedures (including critical care time)  CRITICAL CARE Performed by: Reece Agar   Total critical care time: 45 minutes  Critical care time was exclusive of separately billable procedures and treating other patients.  Critical care was necessary to treat or prevent imminent or life-threatening deterioration.  Critical care was time spent personally by me on the following activities: development of treatment plan with patient and/or surrogate as well as nursing, discussions with consultants, evaluation of patient's response to treatment, examination of patient, obtaining history from patient or surrogate, ordering and performing treatments and interventions, ordering and review of laboratory studies, ordering and review of radiographic studies, pulse oximetry and re-evaluation of patient's condition.   Medications Ordered in ED Medications  diltiazem (CARDIZEM) 100 mg in dextrose 5% 170mL (1 mg/mL) infusion (10 mg/hr Intravenous New Bag/Given 06/12/18 1348)     Initial Impression / Assessment and Plan / ED Course  I have reviewed the triage vital signs and the nursing notes.  Pertinent labs & imaging results that were available during my care of the patient were reviewed by me and considered in my medical decision making (see chart for details).     84 y.o. male here with new onset A. fib noticed by his home health nurse.  He has no symptoms and was unaware that he was in A. fib.  Recently had a UTI 3 weeks ago and has been weak ever since, which is why home health had been involved.  On exam, irregularly irregular rhythm with rate in the 120s, no pedal edema, exam otherwise unremarkable. Will get labs and admit. Spoke with Wannetta Sender of cardiology who will have cardiology team consult on pt. Will start on  diltiazem. Discussed case with my attending Dr. Winfred Leeds who agrees with plan.   3:13 PM CBC w/diff fairly unremarkable. CMP with mildly elevated Cr 1.39. TSH/T4 WNL. U/A not yet done. Trop not yet done. Dr. Laren Everts of [facility] returning page and will admit. Holding orders to be placed by admitting team. Please see their notes for further documentation of care. I appreciate their help with this pleasant pt's care. Pt stable at time of admission.   Final Clinical Impressions(s) / ED Diagnoses   Final diagnoses:  Atrial fibrillation with RVR ([place])  New onset atrial fibrillation [facility])  Weakness    ED Discharge Orders    [address] [place], [place] 06/12/18 [place], MD 06/12/18 1651

## 2018-06-12 NOTE — ED Triage Notes (Signed)
Pt sent here for eval after home RN noted difference in apical and radial pulse; pt per norm per family except for some fatigue

## 2018-06-13 ENCOUNTER — Inpatient Hospital Stay (HOSPITAL_COMMUNITY): Payer: Medicare Other

## 2018-06-13 ENCOUNTER — Encounter (HOSPITAL_COMMUNITY): Payer: Self-pay | Admitting: Cardiology

## 2018-06-13 DIAGNOSIS — N183 Chronic kidney disease, stage 3 unspecified: Secondary | ICD-10-CM | POA: Diagnosis present

## 2018-06-13 DIAGNOSIS — I10 Essential (primary) hypertension: Secondary | ICD-10-CM

## 2018-06-13 DIAGNOSIS — I4891 Unspecified atrial fibrillation: Principal | ICD-10-CM

## 2018-06-13 DIAGNOSIS — Z0389 Encounter for observation for other suspected diseases and conditions ruled out: Secondary | ICD-10-CM

## 2018-06-13 LAB — TROPONIN I
Troponin I: 0.04 ng/mL (ref <0.03)
Troponin I: 0.03 ng/mL (ref <0.03)

## 2018-06-13 LAB — BASIC METABOLIC PANEL
Anion gap: 10 (ref 5–15)
BUN: 14 mg/dL (ref 8–23)
CO2: 22 mmol/L (ref 22–32)
Calcium: 9.1 mg/dL (ref 8.9–10.3)
Chloride: 107 mmol/L (ref 98–111)
Creatinine, Ser: 1.3 mg/dL — ABNORMAL HIGH (ref 0.61–1.24)
GFR calc Af Amer: 55 mL/min — ABNORMAL LOW (ref >60)
GFR calc non Af Amer: 47 mL/min — ABNORMAL LOW (ref >60)
Glucose, Bld: 118 mg/dL — ABNORMAL HIGH (ref 70–99)
Potassium: 4 mmol/L (ref 3.5–5.1)
Sodium: 139 mmol/L (ref 135–145)

## 2018-06-13 MED ORDER — DILTIAZEM HCL-DEXTROSE 100-5 MG/100ML-% IV SOLN (PREMIX)
10.0000 mg/h | INTRAVENOUS | Status: AC
  Filled 2018-06-13: qty 100

## 2018-06-13 MED ORDER — APIXABAN 5 MG PO TABS
5.0000 mg | ORAL_TABLET | Freq: Two times a day (BID) | ORAL | Status: DC
  Administered 2018-06-13 – 2018-06-15 (×5): 5 mg via ORAL
  Filled 2018-06-13 (×5): qty 1

## 2018-06-13 MED ORDER — DILTIAZEM HCL ER COATED BEADS 120 MG PO CP24
120.0000 mg | ORAL_CAPSULE | Freq: Every day | ORAL | Status: DC
  Administered 2018-06-13 – 2018-06-14 (×2): 120 mg via ORAL
  Filled 2018-06-13 (×4): qty 1

## 2018-06-13 NOTE — Consult Note (Signed)
Cardiology Consultation:   Patient ID: [name] [name] MRN: [id number]; DOB: July 24, 1922  Admit date: 06/12/2018 Date of Consult: 06/13/2018  Primary Care Provider: [name], MD Primary Cardiologist: Dr Acie Fredrickson   Patient Profile:   [name] [name] is a 84 y.o. male with a hx of HTN and Alzheimer's demenia who is being seen today for the evaluation of new onset AF at the request of Dr Roger Shelter.  History of Present Illness:   Mr. [name] [name] is a pleasant 84 year old male who is seen today in consult for atrial fibrillation.  The patient has been seen in the past by Dr. Acie Fredrickson.  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  He had a remote heart catheterization in 2000 that showed normal coronaries.  In 2014 he had an episode of syncope while at the [facility].  Echocardiogram then showed normal LV function with mild left atrial dilatation.  It was felt his syncopal spell was orthostatic then.  The last EKG I have before this admission was from 2014 and showed normal sinus rhythm at 70.  The patient has Alzheimer dementia.  He lives at home with his wife.  He is up and around and active in his home and yard.  3 weeks ago he had a urinary tract infection.  He has been weak since then.  A Home Health nurse noted an irregular pulse yesterday and he was sent to the emergency room.  He was found to be in atrial fibrillation with rapid ventricular response with a rate of 128.  He was unaware of any tachycardia.  His wife tells me he has no prior history of any tachycardia.  She also says he is never had exertional chest pain or shortness of breath.  His heart rate is under good control now on IV diltiazem.   Past Medical History:  Diagnosis Date  . Alzheimer disease ([place]) 12/17/2017  . Arthritis   . Atrial fibrillation with RVR ([place]) 06/2018  . Complication of anesthesia    had confusion after surg/  "OK following spinal anesth"  . History of  kidney stones   . Hyperlipemia   . Hypertension   . Hypothyroidism   . Nocturia   . Normal coronary arteries 2000  . Seasonal allergies   . Slow urinary stream   . Visual hallucinations 12/17/2017    Past Surgical History:  Procedure Laterality Date  . CATARACT EXTRACTION     2015, 2017  . CORONARY ANGIOGRAPHY  2000   normal coronaries  . CYSTOSCOPY  2006   for kidney stone  . TONSILLECTOMY    . TOTAL KNEE ARTHROPLASTY  11/19/2011   Procedure: TOTAL KNEE ARTHROPLASTY;  Surgeon: Mauri Pole, MD;  Location: [facility];  Service: Orthopedics;  Laterality: Left;  . TOTAL KNEE ARTHROPLASTY Right 12/29/2012   Procedure: RIGHT TOTAL KNEE ARTHROPLASTY;  Surgeon: Mauri Pole, MD;  Location: [facility];  Service: Orthopedics;  Laterality: Right;     Home Medications:  Prior to Admission medications   Medication Sig Start Date End Date Taking? Authorizing Provider  acetaminophen (TYLENOL) 500 MG tablet Take 1,000 mg by mouth See admin instructions. Take 1,000 mg by mouth in the morning and 1,000 mg at bedtime and an additional 1,000 mg at 3 PM daily as needed for back pain   Yes [provider]  fluticasone (FLONASE) 50 MCG/ACT nasal spray Place 2 sprays into both nostrils daily as needed  for allergies or rhinitis.  05/21/18  Yes [provider]  levothyroxine (SYNTHROID, LEVOTHROID) 75 MCG tablet Take 75 mcg by mouth daily before breakfast.    Yes [provider]  memantine (NAMENDA) 10 MG tablet Take 1 tablet (10 mg total) by mouth 2 (two) times daily. 01/12/18  Yes Sater, Nanine Means, MD  simvastatin (ZOCOR) 20 MG tablet Take 20 mg by mouth every evening.    [provider]    Inpatient Medications: Scheduled Meds: . acetaminophen  1,000 mg Oral TID  . aspirin EC  162 mg Oral Daily  . enoxaparin (LOVENOX) injection  30 mg Subcutaneous Q24H  . levothyroxine  75 mcg Oral QAC breakfast  . memantine  10 mg Oral BID  . sodium chloride flush  3 mL Intravenous  Q12H   Continuous Infusions: . sodium chloride    . diltiazem (CARDIZEM) infusion 10 mg/hr (06/13/18 0538)   PRN Meds: sodium chloride, fluticasone, ondansetron **OR** ondansetron (ZOFRAN) IV, sodium chloride flush  Allergies:    Allergies  Allergen Reactions  . Zocor [Simvastatin] Other (See Comments)    Weakness (legs)    Social History:   Social History   Socioeconomic History  . Marital status: Married    Spouse name: Not on file  . Number of children: 3  . Years of education: 81  . Highest education level: Not on file  Occupational History  . Occupation: Retired  Scientific laboratory technician  . Financial resource strain: Not on file  . Food insecurity:    Worry: Not on file    Inability: Not on file  . Transportation needs:    Medical: Not on file    Non-medical: Not on file  Tobacco Use  . Smoking status: Never Smoker  . Smokeless tobacco: Never Used  Substance and Sexual Activity  . Alcohol use: No  . Drug use: No  . Sexual activity: Not on file  Lifestyle  . Physical activity:    Days per week: Not on file    Minutes per session: Not on file  . Stress: Not on file  Relationships  . Social connections:    Talks on phone: Not on file    Gets together: Not on file    Attends religious service: Not on file    Active member of club or organization: Not on file    Attends meetings of clubs or organizations: Not on file    Relationship status: Not on file  . Intimate partner violence:    Fear of current or ex partner: Not on file    Emotionally abused: Not on file    Physically abused: Not on file    Forced sexual activity: Not on file  Other Topics Concern  . Not on file  Social History Narrative   Lives with wife   Caffeine use: 1 cup coffee daily   Right handed     Family History:   History reviewed. No pertinent family history.   ROS:  Please see the history of present illness.  All other ROS reviewed and negative.     Physical Exam/Data:   Vitals:    06/12/18 2330 06/13/18 0432 06/13/18 0500 06/13/18 0843  BP: 105/78 96/72  109/63  Pulse: 88 69    Resp: 15 15  17   Temp: 97.8 F (36.6 C) 97.6 F (36.4 C)    TempSrc: Oral Oral    SpO2: 97% 97%    Weight:   79.3 kg   Height:  Intake/Output Summary (Last 24 hours) at 06/13/2018 1131 Last data filed at 06/13/2018 0800 Gross per 24 hour  Intake 471.52 ml  Output 425 ml  Net 46.52 ml   Filed Weights   06/12/18 1151 06/12/18 1642 06/13/18 0500  Weight: 86.2 kg 82.4 kg 79.3 kg   Body mass index is 28.23 kg/m.  General:  Well nourished, well developed, in no acute distress HEENT: normal, HOH Lymph: no adenopathy Neck: no JVD Endocrine:  No thryomegaly Vascular: No carotid bruits; FA pulses 2+ bilaterally without bruits  Cardiac: Irregularly irregular rhythm no murmur  Lungs:  clear to auscultation bilaterally, no wheezing, rhonchi or rales  Abd: soft, nontender, no hepatomegaly  Ext: no edema Musculoskeletal:  No deformities, BUE and BLE strength normal and equal Skin: warm and dry  Neuro:  CNs 2-12 intact, no focal abnormalities noted Psych:  Normal affect   EKG:  The EKG was personally reviewed and demonstrates:  AF with VR 72 Telemetry:  Telemetry was personally reviewed and demonstrates:  AF with CVR   Laboratory Data:  Chemistry Recent Labs  Lab 06/12/18 1337 06/13/18 0437  NA 140 139  K 4.9 4.0  CL 108 107  CO2 23 22  GLUCOSE 113* 118*  BUN 13 14  CREATININE 1.39* 1.30*  CALCIUM 9.6 9.1  GFRNONAA 43* 47*  GFRAA 50* 55*  ANIONGAP 9 10    Recent Labs  Lab 06/12/18 1337  PROT 7.5  ALBUMIN 3.7  AST 19  ALT 12  ALKPHOS 54  BILITOT 0.6   Hematology Recent Labs  Lab 06/12/18 1337  WBC 4.8  RBC 4.29  HGB 13.3  HCT 42.3  MCV 98.6  MCH 31.0  MCHC 31.4  RDW 12.6  PLT 134*   Cardiac Enzymes Recent Labs  Lab 06/12/18 1651 06/12/18 2259 06/13/18 0437  TROPONINI 0.04* 0.04* 0.03*    Recent Labs  Lab 06/12/18 1513  TROPIPOC 0.02      BNPNo results for input(s): BNP, PROBNP in the last 168 hours.  DDimer No results for input(s): DDIMER in the last 168 hours.  Radiology/Studies:  No results found.  Assessment and Plan:   Atrial fibrillation with rapid ventricular response This is a new onset of undetermined duration.  The patient is relatively asymptomatic.  His rate is controlled on IV diltiazem currently.  History of hypertension The patient was on no medications for this prior to admission.  Alzheimer's dementia He lives at home with his wife, by her history he is pretty functional.  History of hypothyroidism TSH is within normal limits at 3.9  Elevated troponin Troponin of 0.04, flat trend, this could be attributed to his atrial fibrillation and CRI  Chronic renal insufficiency stage III GFR is 47, serum creatinine 1.3  Plan-I will transition him to p.o. diltiazem.  His CHADS VASC score is 2.  I will discuss chronic long term anticoagulation with Dr. Harrell Gave.  The patient has no prior history of any bleeding issues or falls. He potentially could be discharged later today or tomorrow. We can check an echo as an OP if he is discharged. F/U Dr Acie Fredrickson long term will be arranged. MD to see.       For questions or updates, please contact [place] Please consult [email or web address] for contact info under     Signed, Kerin Ransom, PA-C  06/13/2018 11:31 AM

## 2018-06-13 NOTE — Progress Notes (Signed)
PROGRESS NOTE    Patient: [name] [name]                            PCP: [name], MD                    DOB: Apr 30, 1923            DOA: 06/12/2018 [id number]:[id number]             DOS: 06/13/2018, 3:46 PM   LOS: 1 day   Date of Service: The patient was seen and examined on 06/13/2018  Subjective:   Patient was seen and examined this morning, stable is accompanied by his wife. Risk and benefit of medication including rate control medication and chronic anticoagulation was discussed with the patient and his wife at the bedside in detail.  Patient was still on a Cardizem drip, still in A. fib rate controlled  Cardiology consulted   Brief Narrative:   [name] [name]  is a 84 y.o. male, with past medical history significant for hypertension, hyperlipidemia, hypothyroidism and advanced Alzheimer's dementia presenting today for evaluation of new onset A. fib.  Patient had a diagnosed urinary tract infection around 3 weeks ago and he has been having home health to help with his needs and medications.  Today home health nurse noticed that he has an abnormal rhythm and he was sent to the emergency room to be diagnosed with A. fib with rapid ventricular rate at the rate of 120 bpm.  Patient was started on Cardizem drip.  No previous history of A. fib.  Cardiology has been consulted.   Active Problems:   Essential hypertension   Alzheimer disease (HCC)   Atrial fibrillation with RVR ([facility])   Normal coronary arteries   CRI (chronic renal insufficiency), stage 3 (moderate) ([facility])    Assessment & Plan:     A. fib with RVR New onset A. Fib -Cardizem drip, rate controlled, still in A. fib TSH was normal Echo is pending >>  Cardiology consulted: Discussed with the patient and his wife in detail regarding rate control medications and chronic anticoagulation, patient would like to take the risk with anticoagulation to avoid stroke According to cardiology patient will be started on Eliquis  reduced dose due to age.   Hyperlipidemia Continue statins  Hypertension; not on medications  Advanced dementia  -continue with Namenda  UTI  -continue with nitrofurantoin  Hypothyroidism - continue with Synthroid   DVT prophylaxis: SCD/Compression stockings/initiating 7  Code Status:   Code Status: Full Code  Family Communication:  The above findings and plan of care has been discussed with patient and family in detail, they expressed understanding and agreement of above.  Disposition Plan:   Anticipated 1-2 days (likely home in a.m. if stable)   Consultants: Cardiology  Procedures:   No admission procedures for hospital encounter.   Echocardiogram  Antimicrobials:  Anti-infectives (From admission, onward)   None       Medication:  . acetaminophen  1,000 mg Oral TID  . apixaban  5 mg Oral BID  . diltiazem  120 mg Oral Daily  . levothyroxine  75 mcg Oral QAC breakfast  . memantine  10 mg Oral BID  . sodium chloride flush  3 mL Intravenous Q12H    sodium chloride, fluticasone, ondansetron **OR** ondansetron (ZOFRAN) IV, sodium chloride flush     Objective:   Vitals:   06/13/18 0500 06/13/18 0843 06/13/18  1226 06/13/18 1411  BP:  109/63 104/64 128/76  Pulse:    66  Resp:  17  16  Temp:    97.6 F (36.4 C)  TempSrc:    Oral  SpO2:    97%  Weight: 79.3 kg     Height:        Intake/Output Summary (Last 24 hours) at 06/13/2018 1546 Last data filed at 06/13/2018 1400 Gross per 24 hour  Intake 533.03 ml  Output 575 ml  Net -41.97 ml   Filed Weights   06/12/18 1151 06/12/18 1642 06/13/18 0500  Weight: 86.2 kg 82.4 kg 79.3 kg     Examination:    General exam: Appears calm and comfortable  BP 128/76 (BP Location: Right Arm)   Pulse 66   Temp 97.6 F (36.4 C) (Oral)   Resp 16   Ht 5\' 6"  (1.676 m)   Wt 79.3 kg   SpO2 97%   BMI 28.23 kg/m    Physical Exam  Constitution:  Alert, cooperative, no distress, pleasantly  confused Psychiatric: Normal and stable mood and affect, cognition intact,   HEENT: Normocephalic, PERRL, otherwise with in Normal limits  Chest:Chest symmetric Cardio vascular:  S1/S2, RRR, No murmure, No Rubs or Gallops  pulmonary: Clear to auscultation bilaterally, respirations unlabored, negative wheezes / crackles Abdomen: Soft, non-tender, non-distended, bowel sounds,no masses, no organomegaly Muscular skeletal: Limited exam - in bed, able to move all 4 extremities, Normal strength,  Neuro: CNII-XII intact. , normal motor and sensation, reflexes intact  Extremities: No pitting edema lower extremities, +2 pulses  Skin: Dry, warm to touch, negative for any Rashes, No open wounds Wounds: per nursing documentation  LABs:  CBC Latest Ref Rng & Units 06/12/2018 05/22/2018 12/04/2017  WBC 4.0 - 10.5 K/uL 4.8 7.6 4.1  Hemoglobin 13.0 - 17.0 g/dL 13.3 12.3(L) 12.8(L)  Hematocrit 39.0 - 52.0 % 42.3 38.8(L) 38.3(L)  Platelets 150 - 400 K/uL 134(L) 88(L) 92(L)   CMP Latest Ref Rng & Units 06/13/2018 06/12/2018 05/22/2018  Glucose 70 - 99 mg/dL 118(H) 113(H) 135(H)  BUN 8 - 23 mg/dL 14 13 15   Creatinine 0.61 - 1.24 mg/dL 1.30(H) 1.39(H) 1.03  Sodium 135 - 145 mmol/L 139 140 137  Potassium 3.5 - 5.1 mmol/L 4.0 4.9 4.0  Chloride 98 - 111 mmol/L 107 108 108  CO2 22 - 32 mmol/L 22 23 21(L)  Calcium 8.9 - 10.3 mg/dL 9.1 9.6 8.8(L)  Total Protein 6.5 - 8.1 g/dL - 7.5 -  Total Bilirubin 0.3 - 1.2 mg/dL - 0.6 -  Alkaline Phos 38 - 126 U/L - 54 -  AST 15 - 41 U/L - 19 -  ALT 0 - 44 U/L - 12 -        @IMAGES @

## 2018-06-14 ENCOUNTER — Inpatient Hospital Stay (HOSPITAL_COMMUNITY): Payer: Medicare Other

## 2018-06-14 DIAGNOSIS — I361 Nonrheumatic tricuspid (valve) insufficiency: Secondary | ICD-10-CM

## 2018-06-14 DIAGNOSIS — I340 Nonrheumatic mitral (valve) insufficiency: Secondary | ICD-10-CM

## 2018-06-14 DIAGNOSIS — G301 Alzheimer's disease with late onset: Secondary | ICD-10-CM

## 2018-06-14 DIAGNOSIS — Z7189 Other specified counseling: Secondary | ICD-10-CM

## 2018-06-14 DIAGNOSIS — F0280 Dementia in other diseases classified elsewhere without behavioral disturbance: Secondary | ICD-10-CM

## 2018-06-14 DIAGNOSIS — Z515 Encounter for palliative care: Secondary | ICD-10-CM

## 2018-06-14 LAB — ECHOCARDIOGRAM COMPLETE
Height: 66 in
Weight: 2814.83 oz

## 2018-06-14 MED ORDER — TRAZODONE HCL 50 MG PO TABS
50.0000 mg | ORAL_TABLET | Freq: Every day | ORAL | Status: DC
  Administered 2018-06-14: 50 mg via ORAL
  Filled 2018-06-14: qty 1

## 2018-06-14 MED ORDER — METOPROLOL TARTRATE 5 MG/5ML IV SOLN
5.0000 mg | INTRAVENOUS | Status: DC | PRN
  Administered 2018-06-14: 5 mg via INTRAVENOUS
  Filled 2018-06-14: qty 5

## 2018-06-14 MED ORDER — DILTIAZEM HCL ER COATED BEADS 240 MG PO CP24
240.0000 mg | ORAL_CAPSULE | Freq: Every day | ORAL | Status: DC
  Administered 2018-06-15: 240 mg via ORAL
  Filled 2018-06-14: qty 1

## 2018-06-14 MED ORDER — METOPROLOL SUCCINATE ER 50 MG PO TB24
50.0000 mg | ORAL_TABLET | Freq: Every day | ORAL | Status: DC
  Administered 2018-06-14: 50 mg via ORAL
  Filled 2018-06-14: qty 1

## 2018-06-14 MED ORDER — HALOPERIDOL LACTATE 5 MG/ML IJ SOLN
2.0000 mg | Freq: Four times a day (QID) | INTRAMUSCULAR | Status: DC | PRN
  Administered 2018-06-14: 2 mg via INTRAVENOUS
  Filled 2018-06-14: qty 1

## 2018-06-14 NOTE — Progress Notes (Signed)
Patient noted with HR sustaining in the 130's-140's.  On call Effie Shy, NP made aware.  PRN Metoprolol 5 mg IV ordered for HR > 120 sustained; given at 0506. HR currently in the 80's-90's.  Will continue to monitor.

## 2018-06-14 NOTE — Progress Notes (Addendum)
PROGRESS NOTE    Patient: [name] [name]                            PCP: [name], MD                    DOB: [birth year]            DOA: 06/12/2018 [id number]             DOS: 06/14/2018, 3:36 PM   LOS: 2 days   Date of Service: The patient was seen and examined on 06/14/2018  Subjective:   The patient was seen and examined this morning.  He was accompanied by his daughter and his wife. Apparently he has had a restless night overnight.  With sundowning and confusion. Overnight he continued to be in A. fib with RVR, his heart rate went up as high as 160s  This morning he stable heart rate around 100.  Denying having any chest pain or shortness of breath.  Confusion at baseline.  Pleasant.  Cardiology consulted   Brief Narrative:   Jere Bostrom  is a 84 y.o. male, with past medical history significant for hypertension, hyperlipidemia, hypothyroidism and advanced Alzheimer's dementia presenting today for evaluation of new onset A. fib.  Patient had a diagnosed urinary tract infection around 3 weeks ago and he has been having home health to help with his needs and medications.  Today home health nurse noticed that he has an abnormal rhythm and he was sent to the emergency room to be diagnosed with A. fib with rapid ventricular rate at the rate of 120 bpm.  Patient was started on Cardizem drip.  No previous history of A. fib.  Cardiology has been consulted.   Active Problems:   Essential hypertension   Alzheimer disease (HCC)   Atrial fibrillation with RVR (HCC)   Normal coronary arteries   CRI (chronic renal insufficiency), stage 3 (moderate) (HCC)   Goals of care, counseling/discussion   Palliative care encounter    Assessment & Plan:     A. fib with RVR New onset A. Fib -Cardizem drip, rate controlled, still in A. fib TSH was normal Echo is pending >>  Cardiology consulted: Discussed with the patient and his wife in detail regarding rate control medications  and chronic anticoagulation, patient would like to take the risk with anticoagulation to avoid stroke According to cardiology patient will be started on Eliquis reduced dose due to age.  Cardiology signed off today recommending increasing diltiazem, continuing Eliquis Recommending stopping aspirin and statin.  -Follow-up as an outpatient with cardiologist Dr. Acie Fredrickson.   Hyperlipidemia Continue statins  Hypertension; not on medications  Advanced dementia /with some behavior disturbance and sundowning -continue with Namenda  UTI  -continue with nitrofurantoin  Hypothyroidism - continue with Synthroid   Ethics -patient wife is agreed to DNR/DNI status--CODE STATUS has been changed Palliative care was consulted to determine goals of care   DVT prophylaxis: SCD/Compression stockings/on Eliquis  Code Status:   Code Status: DNR  Family Communication:  The above findings and plan of care has been discussed with patient and family in detail, they expressed understanding and agreement of above.  Disposition Plan: If heart rate is well controlled may be discharged in a.m. on Cardizem, metoprolol, and Eliquis.  Consultants: Cardiology  Procedures:   No admission procedures for hospital encounter.   Echocardiogram  Antimicrobials:  Anti-infectives (From admission, onward)  None       Medication:  . acetaminophen  1,000 mg Oral TID  . apixaban  5 mg Oral BID  . [START ON 06/15/2018] diltiazem  240 mg Oral Daily  . levothyroxine  75 mcg Oral QAC breakfast  . memantine  10 mg Oral BID  . sodium chloride flush  3 mL Intravenous Q12H  . traZODone  50 mg Oral QHS    sodium chloride, fluticasone, haloperidol lactate, metoprolol tartrate, ondansetron **OR** ondansetron (ZOFRAN) IV, sodium chloride flush     Objective:   Vitals:   06/13/18 2003 06/14/18 0429 06/14/18 0516 06/14/18 1049  BP: 139/84 (!) 141/94  111/77  Pulse: 86 (!) 128  94  Resp: 18 16    Temp:  (!) 97.5 F (36.4 C) (!) 97.5 F (36.4 C)    TempSrc: Oral Oral    SpO2: 99% 97%    Weight:   79.8 kg   Height:        Intake/Output Summary (Last 24 hours) at 06/14/2018 1536 Last data filed at 06/14/2018 1500 Gross per 24 hour  Intake 600 ml  Output 200 ml  Net 400 ml   Filed Weights   06/12/18 1642 06/13/18 0500 06/14/18 0516  Weight: 82.4 kg 79.3 kg 79.8 kg     Examination:    General exam: Appears calm and comfortable , confusion at baseline BP 111/77   Pulse 94   Temp (!) 97.5 F (36.4 C) (Oral)   Resp 16   Ht 5\' 6"  (1.676 m)   Wt 79.8 kg   SpO2 97%   BMI 28.40 kg/m    Physical Exam  Constitution:  Alert, cooperative, no distress, pleasantly confused Psychiatric: Normal and stable mood and affect, cognition intact,   HEENT: Normocephalic, PERRL, otherwise with in Normal limits  Chest:Chest symmetric Cardio vascular:  S1/S2, RRR, No murmure, No Rubs or Gallops  pulmonary: Clear to auscultation bilaterally, respirations unlabored, negative wheezes / crackles Abdomen: Soft, non-tender, non-distended, bowel sounds,no masses, no organomegaly Muscular skeletal: Limited exam - in bed, able to move all 4 extremities, Normal strength,  Neuro: CNII-XII intact. , normal motor and sensation, reflexes intact  Extremities: No pitting edema lower extremities, +2 pulses  Skin: Dry, warm to touch, negative for any Rashes, No open wounds Wounds: per nursing documentation      LABs:  CBC Latest Ref Rng & Units 06/12/2018 05/22/2018 12/04/2017  WBC 4.0 - 10.5 K/uL 4.8 7.6 4.1  Hemoglobin 13.0 - 17.0 g/dL 13.3 12.3(L) 12.8(L)  Hematocrit 39.0 - 52.0 % 42.3 38.8(L) 38.3(L)  Platelets 150 - 400 K/uL 134(L) 88(L) 92(L)   CMP Latest Ref Rng & Units 06/13/2018 06/12/2018 05/22/2018  Glucose 70 - 99 mg/dL 118(H) 113(H) 135(H)  BUN 8 - 23 mg/dL 14 13 15   Creatinine 0.61 - 1.24 mg/dL 1.30(H) 1.39(H) 1.03  Sodium 135 - 145 mmol/L 139 140 137  Potassium 3.5 - 5.1 mmol/L 4.0 4.9  4.0  Chloride 98 - 111 mmol/L 107 108 108  CO2 22 - 32 mmol/L 22 23 21(L)  Calcium 8.9 - 10.3 mg/dL 9.1 9.6 8.8(L)  Total Protein 6.5 - 8.1 g/dL - 7.5 -  Total Bilirubin 0.3 - 1.2 mg/dL - 0.6 -  Alkaline Phos 38 - 126 U/L - 54 -  AST 15 - 41 U/L - 19 -  ALT 0 - 44 U/L - 12 -

## 2018-06-14 NOTE — Consult Note (Signed)
Consultation Note Date: 06/14/2018   Patient Name: [name] [name]  DOB: [birth year]  MRN: [id number]  Age / Sex: 84 y.o., male  PCP: [name], [name] Him, MD Referring Physician: Deatra James, MD  Reason for Consultation: Establishing goals of care and Psychosocial/spiritual support  HPI/Patient Profile: 84 y.o. male  with past medical history of Alzheimer's dementia, HTN, and osteoarthritis  who was admitted on 06/12/2018 with new onset atrial fibrillation.  Of note he was diagnosed 3 weeks ago with UTI.   He has experienced agitation and confusion in the hospital.   His wife [name] [name] asked to meet with Palliative Care.  Clinical Assessment and Goals of Care:  I have reviewed medical records including EPIC notes, labs and imaging, received report from Dr. Roger [name], assessed the patient and then met at the bedside along with his wife, [name] [name], to discuss diagnosis prognosis, GOC, EOL wishes, disposition and options.  I introduced Palliative Medicine as specialized medical care for people living with serious illness. It focuses on providing relief from the symptoms and stress of a serious illness. The goal is to improve quality of life for both the patient and the family.  We discussed a brief life review of the patient.  He was a Dealer.  [name] [name] is his second wife and they have been married 55 years.  He was diagnosed with Alzheimer's dementia 3 years ago but [name] [name] feels he has had it longer than that.  [name] [name] is spiritual and belongs to the [facility].   As far as functional and nutritional [name] [name] has noticed a significant decline in the last several weeks.  He is now somewhat unsteady on his feet.  Illnesses necessitating trips to the hospital have progressed his dementia.  Advanced directives, concepts specific to code status, artifical feeding and hydration, and rehospitalization were  considered and discussed.  Scotty and I reviewed and completed a MOST form.  If [name] [name] has cardiac or respiratory arrest, then resuscitation is NOT to be attempted.  If he is ill but has not arrested, [name] [name] opted for limited interventions - return to the hospital for treatment but avoid the ICU.  IVF and Antibiotics are ok.  No feeding tube.  Hospice and Palliative Care services outpatient were explained and offered.  Scotty would like for [name] [name] to be discharge with Palliative Care services.  Questions and concerns were addressed.  Hard Choices booklet left for review. The family was encouraged to call with questions or concerns.   Primary Decision Maker:  NEXT OF KIN wife    SUMMARY OF RECOMMENDATIONS    MOST form completed, copied and left on the hard chart.   DNR/DNI Limited Interventions IVF and antibiotics are OK. No feeding Tube.  Code Status/Advance Care Planning:  DNR   Symptom Management:   Agree with haldol PRN agitation.  Additional Recommendations (Limitations, Scope, Preferences):  Minimize medications.  Consider whether or not namenda is beneficial to him at this point.  It is not therapeutic in moderate or advanced dementia.  It is  most effective in early dementia.  In moderate to advanced dementia side effects can include falling and bradycardia.   Palliative Prophylaxis:   Delirium Protocol  Psycho-social/Spiritual:   Desire for further Chaplaincy support:  yes  Prognosis:   Months+ at this point.    Discharge Planning: Home with Palliative Services and home health.      Primary Diagnoses: Present on Admission: . Atrial fibrillation with RVR ([place]) . Essential hypertension . Alzheimer disease ([place]) . CRI (chronic renal insufficiency), stage 3 (moderate) ([facility])   I have reviewed the medical record, interviewed the patient and family, and examined the patient. The following aspects are pertinent.  Past Medical History:  Diagnosis Date  .  Alzheimer disease ([place]) 12/17/2017  . Arthritis   . Atrial fibrillation with RVR ([place]) 06/2018  . Complication of anesthesia    had confusion after surg/  "OK following spinal anesth"  . History of kidney stones   . Hyperlipemia   . Hypertension   . Hypothyroidism   . Nocturia   . Normal coronary arteries 2000  . Seasonal allergies   . Slow urinary stream   . Visual hallucinations 12/17/2017   Social History   Socioeconomic History  . Marital status: Married    Spouse name: Not on file  . Number of children: 3  . Years of education: 44  . Highest education level: Not on file  Occupational History  . Occupation: Retired  Scientific laboratory technician  . Financial resource strain: Not on file  . Food insecurity:    Worry: Not on file    Inability: Not on file  . Transportation needs:    Medical: Not on file    Non-medical: Not on file  Tobacco Use  . Smoking status: Never Smoker  . Smokeless tobacco: Never Used  Substance and Sexual Activity  . Alcohol use: No  . Drug use: No  . Sexual activity: Not on file  Lifestyle  . Physical activity:    Days per week: Not on file    Minutes per session: Not on file  . Stress: Not on file  Relationships  . Social connections:    Talks on phone: Not on file    Gets together: Not on file    Attends religious service: Not on file    Active member of club or organization: Not on file    Attends meetings of clubs or organizations: Not on file    Relationship status: Not on file  Other Topics Concern  . Not on file  Social History Narrative   Lives with wife   Caffeine use: 1 cup coffee daily   Right handed    History reviewed. No pertinent family history. Scheduled Meds: . acetaminophen  1,000 mg Oral TID  . apixaban  5 mg Oral BID  . [START ON 06/15/2018] diltiazem  240 mg Oral Daily  . levothyroxine  75 mcg Oral QAC breakfast  . memantine  10 mg Oral BID  . sodium chloride flush  3 mL Intravenous Q12H  . traZODone  50 mg Oral QHS    Continuous Infusions: . sodium chloride     PRN Meds:.sodium chloride, fluticasone, haloperidol lactate, metoprolol tartrate, ondansetron **OR** ondansetron (ZOFRAN) IV, sodium chloride flush Allergies  Allergen Reactions  . Zocor [Simvastatin] Other (See Comments)    Weakness (legs)   Review of Systems patient is pleasantly demented.  Physical Exam  Well developed pleasantly demented male, awake CV rrr  resp no distress Abdomen soft, nt, nd, +  bs  Vital Signs: BP 111/77   Pulse 94   Temp (!) 97.5 F (36.4 C) (Oral)   Resp 16   Ht '5\' 6"'$  (1.676 m)   Wt 79.8 kg   SpO2 97%   BMI 28.40 kg/m  Pain Scale: 0-10   Pain Score: 0-No pain   SpO2: SpO2: 97 % O2 Device:SpO2: 97 % O2 Flow Rate: .   IO: Intake/output summary:   Intake/Output Summary (Last 24 hours) at 06/14/2018 1421 Last data filed at 06/14/2018 1300 Gross per 24 hour  Intake 360 ml  Output 200 ml  Net 160 ml    LBM:   Baseline Weight: Weight: 86.2 kg Most recent weight: Weight: 79.8 kg     Palliative Assessment/Data:  50%     Time In: 1:30 Time Out: 2:40 Time Total: 70 min. Greater than 50%  of this time was spent counseling and coordinating care related to the above assessment and plan.  Signed by: Florentina Jenny, PA-C Palliative Medicine Pager: [phone]  Please contact Palliative Medicine Team phone at [phone] for questions and concerns.  For individual provider: See Shea Evans

## 2018-06-14 NOTE — Progress Notes (Signed)
  Echocardiogram 2D Echocardiogram has been performed.  Johny Chess 06/14/2018, 2:01 PM

## 2018-06-14 NOTE — Progress Notes (Signed)
Progress Note  Patient Name: [name] [name] Date of Encounter: 06/14/2018  Primary Cardiologist: [name], MD   Subjective   Patient resting on my interview. Per his family, overnight he was agitated throughout the night and had sustained heart rates in the 130s. He is currently resting with heart rates in the 70s. Tolerated start of apixaban without bleeding thus far.   Inpatient Medications    Scheduled Meds: . acetaminophen  1,000 mg Oral TID  . apixaban  5 mg Oral BID  . diltiazem  120 mg Oral Daily  . levothyroxine  75 mcg Oral QAC breakfast  . memantine  10 mg Oral BID  . metoprolol succinate  50 mg Oral Daily  . sodium chloride flush  3 mL Intravenous Q12H  . traZODone  50 mg Oral QHS   Continuous Infusions: . sodium chloride     PRN Meds: sodium chloride, fluticasone, haloperidol lactate, metoprolol tartrate, ondansetron **OR** ondansetron (ZOFRAN) IV, sodium chloride flush   Vital Signs    Vitals:   06/13/18 2003 06/14/18 0429 06/14/18 0516 06/14/18 1049  BP: 139/84 (!) 141/94  111/77  Pulse: 86 (!) 128  94  Resp: 18 16    Temp: (!) 97.5 F (36.4 C) (!) 97.5 F (36.4 C)    TempSrc: Oral Oral    SpO2: 99% 97%    Weight:   79.8 kg   Height:        Intake/Output Summary (Last 24 hours) at 06/14/2018 1136 Last data filed at 06/14/2018 0651 Gross per 24 hour  Intake 421.51 ml  Output 350 ml  Net 71.51 ml   Filed Weights   06/12/18 1642 06/13/18 0500 06/14/18 0516  Weight: 82.4 kg 79.3 kg 79.8 kg    Telemetry    Atrial fibrillation with rates in the 130s overnight, now in the 70s - Personally Reviewed  ECG    No new since yesterday - Personally Reviewed  Physical Exam   GEN: No acute distress.   Neck: No JVD Cardiac: irregularly irregular, no murmurs, rubs, or gallops.  Respiratory: Clear to auscultation bilaterally. GI: Soft, nontender, non-distended  MS: No edema; No deformity. Neuro:  Nonfocal  Psych: pleasant though poor  historian, unclear retention of discussion  Labs    Chemistry Recent Labs  Lab 06/12/18 1337 06/13/18 0437  NA 140 139  K 4.9 4.0  CL 108 107  CO2 23 22  GLUCOSE 113* 118*  BUN 13 14  CREATININE 1.39* 1.30*  CALCIUM 9.6 9.1  PROT 7.5  --   ALBUMIN 3.7  --   AST 19  --   ALT 12  --   ALKPHOS 54  --   BILITOT 0.6  --   GFRNONAA 43* 47*  GFRAA 50* 55*  ANIONGAP 9 10     Hematology Recent Labs  Lab 06/12/18 1337  WBC 4.8  RBC 4.29  HGB 13.3  HCT 42.3  MCV 98.6  MCH 31.0  MCHC 31.4  RDW 12.6  PLT 134*    Cardiac Enzymes Recent Labs  Lab 06/12/18 1651 06/12/18 2259 06/13/18 0437  TROPONINI 0.04* 0.04* 0.03*    Recent Labs  Lab 06/12/18 1513  TROPIPOC 0.02     BNPNo results for input(s): BNP, PROBNP in the last 168 hours.   DDimer No results for input(s): DDIMER in the last 168 hours.   Radiology    No results found.  Cardiac Studies   Pending echo  Patient Profile  84 y.o. male with a hx of HTN and Alzheimer's demenia who is seen for the evaluation of new onset AF.  Assessment & Plan    Atrial fibrillation: with RVR on presentation CHA2DS2/VAS Stroke Risk Points = 3  Points Metrics  0 Has Congestive Heart Failure:  No   0 Has Vascular Disease:  No   1 Has Hypertension:  Yes   2 Age:  29   0 Has Diabetes:  No   0 Had Stroke:  No  Had TIA:  No  Had thromboembolism:  No   0 Male:  No    -started apixaban, does not meet criteria for reduced dose now but if renal function worsens would reevaluate -had elevated heart rate overnight, was started on metoprolol. For ease of adherence, will increase diltiazem dose and stop metoprolol. If EF reduced on echo, would use metoprolol instead -thyroid WNL -troponins flat and low, likely demand, no chest pain  -echo pending  His wife and I discussed his cardiac issues at length. She is also wishing to talk to palliative care. She wants to make sure that he has DNR paperwork completed and wants  to make a plan to treat symptoms but avoid invasive medical procedures/repeat hospitalizations in the future. I think this is very wise. From a cardiac standpoint, if echo is unremarkable and he tolerates rate control, he can be further managed in follow up in clinic. Given potential for discharge tomorrow per the wife, we will sign off but would be happy to answer any additional questions.    [facility] will sign off in anticipation of discharge.   Medication Recommendations:  Diltiazem (increased dose ordered), apixaban as ordered. Given his age, I agree with stopping statin, does not need aspirin. Other recommendations (labs, testing, etc):  Pending echo Follow up as an outpatient:  I have requested a transition of care appointment, follow up with Dr. Acie Fredrickson long term  TIME SPENT WITH PATIENT: >35 minutes of direct patient care. More than 50% of that time was spent on coordination of care and counseling regarding cardiac medication management, monitoring, follow up plans, signs/symptoms to watch for. Discussed long term planning at length with patient's wife.  Buford Dresser, MD, PhD [facility]   For questions or updates, please contact [place] Please consult [email or web address] for contact info under     Signed, Buford Dresser, MD  06/14/2018, 11:36 AM

## 2018-06-15 MED ORDER — APIXABAN 5 MG PO TABS: 5.0000 mg | ORAL_TABLET | Freq: Two times a day (BID) | ORAL | 0 refills | Status: DC

## 2018-06-15 MED ORDER — DILTIAZEM HCL ER COATED BEADS 240 MG PO CP24: 240.0000 mg | ORAL_CAPSULE | Freq: Every day | ORAL | 0 refills | Status: DC

## 2018-06-15 MED FILL — ELIQUIS 5 MG TABLET: 5 | 30 days supply | Qty: 60 | Fill #0

## 2018-06-15 MED FILL — CARTIA XT 240 MG CAPSULE: 240 | 30 days supply | Qty: 30 | Fill #0

## 2018-06-15 NOTE — Care Management Important Message (Signed)
Important Message  Patient Details  Name: CAEDIN MOGAN MRN: [id number] Date of Birth: Nov 16, 1922   Medicare Important Message Given:  Yes    Erenest Rasher, RN 06/15/2018, 11:21 AM

## 2018-06-15 NOTE — Care Management (Signed)
#    2.   S/W  BRITTANY  @ [facility]  # [id number]   ELIQUIS    5 MG BID COVER- YES CO-PAY- $ 45.00 TIER- 3 DRUG PRIOR APPROVAL- NO  NO DEDUCTIBLE  PREFERRED PHARMACY : YES [facility] AND  [facility] M/O 90 DAY SUPPLY FOR M/O  $ 125.00

## 2018-06-15 NOTE — Progress Notes (Signed)
Talked with wife at bedside.  Patient is eligible for [facility] and [facility] thru [place].  Family would like to take these services in addition to [facility] from [facility].  PE:  Well developed elderly male, lethargic, pale CV irreg regular rate Resp no distress  Assessment:  84 yo male with new Afib RVR.  Now rate controlled.  Had agitation (sundowning?) received Haldol.  Still sleepy as a result.  Recommendations:  Plan for DC home today after he wakes up with Home Health and Palliative thru [facility]. MOST form completed yesterday.  Original to go with patient.  Copy left on chart to scan into Epic.  Florentina Jenny, PA-C Palliative Medicine Pager: [phone]  Time 25 min.

## 2018-06-15 NOTE — Care Management Note (Addendum)
Case Management Note  Patient Details  Name: [name] [name] MRN: [id number] Date of Birth: Sep 07, 1922  Subjective/Objective:     afib with RVR, new onset               Action/Plan: NCM spoke to pt and wife at bedside. Offered choice for HH/CMS list provided/placed on chart. Pt was active with [facility] and wife wants to resume care with [facility]. He has RW and 3n1 bedside commode from previous knee surgeries. Contacted [facility] with resumption of care. Offered choice for Palliative/CMS list placed on chart/provided list to wife. Arranged Palliative services with [facility]. Faxed referral to [facility]. Pt will receive his Eliquis from [place]. Will send for benefits check. Contacted [facility] rep with new referral.   06/16/2018 Wife aware of copay for Eliquis will be $45.  Expected Discharge Date:  06/15/18               Expected Discharge Plan:  [place]  In-House Referral:  [facility]  Discharge planning Services  CM Consult  Post Acute Care Choice:  Home Health, Resumption of Svcs/PTA Provider Choice offered to:  Spouse  DME Arranged:  N/A DME Agency:  NA  HH Arranged:  PT, [facility] [place] Agency:  [facility]  Status of Service:  Completed, signed off  If discussed at [place] of Stay Meetings, dates discussed:    Additional Comments:  Erenest Rasher, RN 06/15/2018, 11:26 AM

## 2018-06-15 NOTE — Discharge Summary (Signed)
[name] [name], is a 84 y.o. male  DOB 03-Jul-1923  MRN [id number].  Admission date:  06/12/2018  Admitting Physician  Merton Border, MD  Discharge Date:  06/15/2018   Primary MD  Tisovec, Fransico Him, MD  Recommendations for primary care physician for things to follow:  -Please check  CBC, BMP during next visit  Admission Diagnosis  Weakness [R53.1] New onset atrial fibrillation ([place]) [I48.91] Atrial fibrillation with RVR ([place]) [I48.91]   Discharge Diagnosis  Weakness [R53.1] New onset atrial fibrillation ([place]) [I48.91] Atrial fibrillation with RVR ([place]) [I48.91]    Active Problems:   Essential hypertension   Alzheimer disease ([place])   Atrial fibrillation with RVR ([place])   Normal coronary arteries   CRI (chronic renal insufficiency), stage 3 (moderate) ([place])   Goals of care, counseling/discussion   Palliative care encounter      Past Medical History:  Diagnosis Date  . Alzheimer disease ([place]) 12/17/2017  . Arthritis   . Atrial fibrillation with RVR ([place]) 06/2018  . Complication of anesthesia    had confusion after surg/  "OK following spinal anesth"  . History of kidney stones   . Hyperlipemia   . Hypertension   . Hypothyroidism   . Nocturia   . Normal coronary arteries 2000  . Seasonal allergies   . Slow urinary stream   . Visual hallucinations 12/17/2017    Past Surgical History:  Procedure Laterality Date  . CATARACT EXTRACTION     2015, 2017  . CORONARY ANGIOGRAPHY  2000   normal coronaries  . CYSTOSCOPY  2006   for kidney stone  . TONSILLECTOMY    . TOTAL KNEE ARTHROPLASTY  11/19/2011   Procedure: TOTAL KNEE ARTHROPLASTY;  Surgeon: Mauri Pole, MD;  Location: [facility];  Service: Orthopedics;  Laterality: Left;  . TOTAL KNEE ARTHROPLASTY Right 12/29/2012   Procedure: RIGHT TOTAL KNEE ARTHROPLASTY;  Surgeon: Mauri Pole, MD;  Location: [facility];  Service: Orthopedics;   Laterality: Right;       History of present illness and  Hospital Course:     Kindly see H&P for history of present illness and admission details, please review complete Labs, Consult reports and Test reports for all details in brief  HPI  from the history and physical done on the day of admission 06/12/2018 [name] [name]  is a 84 y.o. male, with past medical history significant for hypertension, hyperlipidemia, hypothyroidism and advanced Alzheimer's dementia presenting today for evaluation of new onset A. fib.  Patient had a diagnosed urinary tract infection around 3 weeks ago and he has been having home health to help with his needs and medications.  Today home health nurse noticed that he has an abnormal rhythm and he was sent to the emergency room to be diagnosed with A. fib with rapid ventricular rate at the rate of 120 bpm.  Patient was started on Cardizem drip.  No previous history of A. fib.  Cardiology has been consulted.    Hospital Course   A. fib with  RVR New onset A. Fib -Initially on Cardizem drip, rate controlled, cardiology consulted, has been transitioned to Cardizem CD 240 mg oral daily . Cardizem drip, rate controlled, still in A. fib TSH was normal Echo with preserved EF at 20 to 60%, with no regional wall motion abnormalities  Cardiology consulted: Discussed with the patient and his wife in detail regarding rate control medications and chronic anticoagulation, patient would like to take the risk with anticoagulation to avoid stroke.as well Recommending stopping aspirin and statin.  -Follow-up as an outpatient with cardiologist Dr. Acie Fredrickson.   Hyperlipidemia Catheter has been stopped per cardiology recommendation  Hypertension; not on medications, now on Cardizem CD for heart rate control  Advanced dementia /with some behavior disturbance and sundowning -continue with Namenda  Hypothyroidism - continue with Synthroid    Discharge Condition:   Stable   Follow UP  Follow-up Information    Nahser, Wonda Cheng, MD Follow up.   Specialty:  Cardiology Why:  office will contact you Contact information: [place] [address] [phone]        Tisovec, Fransico Him, MD Follow up in 1 week(s).   Specialty:  Internal Medicine Contact information: [address] [place] [address] [phone]             Discharge Instructions  and  Discharge Medications     Discharge Instructions    Discharge instructions   Complete by:  As directed    Follow with Primary MD Tisovec, Fransico Him, MD in 7 days   Get CBC, CMP,checked  by Primary MD next visit.    Activity: As tolerated with Full fall precautions use walker/cane & assistance as needed   Disposition Home    Diet: Heart Healthy ** , with feeding assistance and aspiration precautions.  For Heart failure patients - Check your Weight same time everyday, if you gain over 2 pounds, or you develop in leg swelling, experience more shortness of breath or chest pain, call your Primary MD immediately. Follow Cardiac Low Salt Diet and 1.5 lit/day fluid restriction.   On your next visit with your primary care physician please Get Medicines reviewed and adjusted.   Please request your Prim.MD to go over all Hospital Tests and Procedure/Radiological results at the follow up, please get all Hospital records sent to your Prim MD by signing hospital release before you go home.   If you experience worsening of your admission symptoms, develop shortness of breath, life threatening emergency, suicidal or homicidal thoughts you must seek medical attention immediately by calling 911 or calling your MD immediately  if symptoms less severe.  You Must read complete instructions/literature along with all the possible adverse reactions/side effects for all the Medicines you take and that have been prescribed to you. Take any new Medicines after you have  completely understood and accpet all the possible adverse reactions/side effects.   Do not drive, operating heavy machinery, perform activities at heights, swimming or participation in water activities or provide baby sitting services if your were admitted for syncope or siezures until you have seen by Primary MD or a Neurologist and advised to do so again.  Do not drive when taking Pain medications.    Do not take more than prescribed Pain, Sleep and Anxiety Medications  Special Instructions: If you have smoked or chewed Tobacco  in the last 2 yrs please stop smoking, stop any regular Alcohol  and or any Recreational drug use.  Wear Seat belts while  driving.   Please note  You were cared for by a hospitalist during your hospital stay. If you have any questions about your discharge medications or the care you received while you were in the hospital after you are discharged, you can call the unit and asked to speak with the hospitalist on call if the hospitalist that took care of you is not available. Once you are discharged, your primary care physician will handle any further medical issues. Please note that NO REFILLS for any discharge medications will be authorized once you are discharged, as it is imperative that you return to your primary care physician (or establish a relationship with a primary care physician if you do not have one) for your aftercare needs so that they can reassess your need for medications and monitor your lab values.   Increase activity slowly   Complete by:  As directed      Allergies as of 06/15/2018      Reactions   Zocor [simvastatin] Other (See Comments)   Weakness (legs)      Medication List    STOP taking these medications   simvastatin 20 MG tablet Commonly known as:  ZOCOR     TAKE these medications   acetaminophen 500 MG tablet Commonly known as:  TYLENOL Take 1,000 mg by mouth See admin instructions. Take 1,000 mg by mouth in the morning and  1,000 mg at bedtime and an additional 1,000 mg at 3 PM daily as needed for back pain   apixaban 5 MG Tabs tablet Commonly known as:  ELIQUIS Take 1 tablet (5 mg total) by mouth 2 (two) times daily.   diltiazem 240 MG 24 hr capsule Commonly known as:  CARDIZEM CD Take 1 capsule (240 mg total) by mouth daily. Start taking on:  06/16/2018   fluticasone 50 MCG/ACT nasal spray Commonly known as:  FLONASE Place 2 sprays into both nostrils daily as needed for allergies or rhinitis.   levothyroxine 75 MCG tablet Commonly known as:  SYNTHROID, LEVOTHROID Take 75 mcg by mouth daily before breakfast.   memantine 10 MG tablet Commonly known as:  NAMENDA Take 1 tablet (10 mg total) by mouth 2 (two) times daily.         Diet and Activity recommendation: See Discharge Instructions above   Consults obtained -  Cardiology   Major procedures and Radiology Reports - PLEASE review detailed and final reports for all details, in brief -      No results found.  Micro Results   No results found for this or any previous visit (from the past 240 hour(s)).     Today   Subjective:   [name] [name] today has no headache,no chest or abdominal pain, patient is feeling better today, wants to go home. Objective:   Blood pressure 109/75, pulse 81, temperature 98.7 F (37.1 C), temperature source Oral, resp. rate 17, height 5\' 6"  (1.676 m), weight 80.1 kg, SpO2 98 %.   Intake/Output Summary (Last 24 hours) at 06/15/2018 1005 Last data filed at 06/15/2018 0651 Gross per 24 hour  Intake 840 ml  Output -  Net 840 ml    Exam Awake Alert, pleasant [place].AT,PERRAL Supple Neck,No JVD, No cervical lymphadenopathy appriciated.  Symmetrical Chest wall movement, Good air movement bilaterally, CTAB Irregular irregular,No Gallops,Rubs or new Murmurs, No Parasternal Heave +ve B.Sounds, Abd Soft, Non tender,No rebound -guarding or rigidity. No Cyanosis, Clubbing or edema, No new Rash or  bruise  Data Review   CBC w  Diff:  Lab Results  Component Value Date   WBC 4.8 06/12/2018   HGB 13.3 06/12/2018   HCT 42.3 06/12/2018   PLT 134 (L) 06/12/2018   LYMPHOPCT 28 06/12/2018   MONOPCT 11 06/12/2018   EOSPCT 2 06/12/2018   BASOPCT 0 06/12/2018    CMP:  Lab Results  Component Value Date   NA 139 06/13/2018   K 4.0 06/13/2018   CL 107 06/13/2018   CO2 22 06/13/2018   BUN 14 06/13/2018   CREATININE 1.30 (H) 06/13/2018   PROT 7.5 06/12/2018   ALBUMIN 3.7 06/12/2018   BILITOT 0.6 06/12/2018   ALKPHOS 54 06/12/2018   AST 19 06/12/2018   ALT 12 06/12/2018  .   Total Time in preparing paper work, data evaluation and todays exam - 23 minutes  Phillips Climes M.D on 06/15/2018 at 10:05 AM  [facility]   Office  [phone]

## 2018-06-16 ENCOUNTER — Other Ambulatory Visit: Payer: Self-pay | Admitting: *Deleted

## 2018-06-16 NOTE — Patient Outreach (Signed)
[facility]) Care Management  06/16/2018  [name] [name] [birth year] [id number]   Referral received from hospital liaison as member was recently admitted 12/6-12/9 with A-fib.  Per chart, he also has history of hypertension, diverticulosis, alzheimer, and hyperlipidemia.  Per hospital liaison, member need follow up on referral to [facility].  Referral was placed from hospital.    Call placed to [facility] caregiver/wife, member's identity confirmed.  She report she has received call from [facility], they will come visit this afternoon.  She denies receiving call from [facility] as of yet.  She request that they call after 3pm today or anytime the rest of the week.  Denies any urgent concerns.  Call placed to [facility], spoke with [facility].  She confirms they have received referral and has reached out to primary MD for order.  Lesleigh Noe is advised of time request to reach out to member's wife.  Will follow up with wife and/or [facility] within the next week to confirm member is active.  If he is active, will close case.  Valente David, [place], MSN [place] [phone]

## 2018-06-17 ENCOUNTER — Telehealth: Payer: Self-pay | Admitting: Cardiovascular Disease

## 2018-06-17 NOTE — Telephone Encounter (Signed)
Patient contacted regarding discharge from [facility] on 06/15/2018.  Patient understands to follow up with provider Ermalinda Barrios, PA-c on 06/24/2018 at 12:00 at [facility] in [place]. Patient understands discharge instructions? Yes Patient understands medications and regiment? Yes Patient understands to bring all medications to this visit? Yes

## 2018-06-17 NOTE — Telephone Encounter (Signed)
New Message   [facility] appointment made on 06/24/18 at 12:00 with Estella Husk per Dr. Buford Dresser

## 2018-06-22 ENCOUNTER — Other Ambulatory Visit: Payer: Self-pay | Admitting: *Deleted

## 2018-06-22 NOTE — Patient Outreach (Signed)
[facility]) Care Management  06/22/2018  SOPHEAP BOEHLE Dec 23, 1922 [id number]   Call placed to member's wife to follow up on contact with [facility].  She report she has been contacted and has first home visit scheduled for tomorrow.  Denies any questions/concerns.  Will close case at this time as referral was to confirm connection/involvement with [facility].  Valente David, [place], MSN [place] [phone]

## 2018-06-24 ENCOUNTER — Encounter: Payer: Self-pay | Admitting: Physician Assistant

## 2018-06-24 ENCOUNTER — Ambulatory Visit: Payer: Medicare Other | Admitting: Physician Assistant

## 2018-06-24 ENCOUNTER — Encounter: Payer: Self-pay | Admitting: Adult Health

## 2018-06-24 ENCOUNTER — Ambulatory Visit: Payer: Medicare Other | Admitting: Adult Health

## 2018-06-24 VITALS — BP 140/68 | HR 67 | Ht 66.0 in | Wt 185.4 lb

## 2018-06-24 VITALS — BP 132/78 | HR 73 | Ht 66.0 in | Wt 184.8 lb

## 2018-06-24 DIAGNOSIS — F0280 Dementia in other diseases classified elsewhere without behavioral disturbance: Secondary | ICD-10-CM

## 2018-06-24 DIAGNOSIS — G301 Alzheimer's disease with late onset: Secondary | ICD-10-CM | POA: Diagnosis not present

## 2018-06-24 DIAGNOSIS — I4891 Unspecified atrial fibrillation: Secondary | ICD-10-CM | POA: Diagnosis not present

## 2018-06-24 DIAGNOSIS — I10 Essential (primary) hypertension: Secondary | ICD-10-CM

## 2018-06-24 MED ORDER — MEMANTINE HCL 10 MG PO TABS: 10.0000 mg | ORAL_TABLET | Freq: Two times a day (BID) | ORAL | 3 refills | Status: AC

## 2018-06-24 MED ORDER — DILTIAZEM HCL ER COATED BEADS 360 MG PO CP24: 360.0000 mg | ORAL_CAPSULE | Freq: Every day | ORAL | 0 refills | Status: AC

## 2018-06-24 NOTE — Patient Instructions (Addendum)
Your Plan:  Continue Namenda Memory slightly declined since last visit If your symptoms worsen or you develop new symptoms please let us know.    Thank you for coming to see [place] at [facility]. I hope we have been able to provide you high quality care today.  You may receive a patient satisfaction survey over the next few weeks. We would appreciate your feedback and comments so that we may continue to improve ourselves and the health of our patients.

## 2018-06-24 NOTE — Progress Notes (Signed)
[name]: [name] [name] DOB: July 20, 1922  REASON FOR VISIT: follow up HISTORY FROM: [name]  HISTORY OF PRESENT ILLNESS: Today 06/24/18:  [name] [name] is a 84 year old male with a history of memory disturbance.  [name] [name] returns today for follow-up.  [name] [name] is currently on Namenda 10 mg twice a day.  [name] [name] is tolerating this medication well.  Wife reports that [name] [name] had a UTI approximately 5 weeks ago.  She reports that his cognition was slightly worse but is starting to level out.  She states that approximately 2 weeks ago [name] [name] had an episode and was found to be in A. fib and was started on Eliquis.  [name] [name] sees his cardiologist today.  [name] [name] is able to complete all ADLs independently.  Denies any trouble sleeping.  Reports good appetite.  Denies hallucinations.  [name] [name] returns today for an evaluation.  HISTORY (copied from Dr. Tobey Grim note) [name] [name] is a 84 year old right-handed white male with a history of a progressive memory disturbance that has been present for at least 2-1/2 or 3 years.  [name] [name] comes in today with his wife.  [name] [name] has a significant level of dementia, [name] [name] is not able to operate a motor vehicle, [name] [name] requires assistance with keeping up with medications and appointments, [name] [name] no longer does [name] finances.  [name] [name] can dress himself but his wife has to lay [name] clothes out for him.  [name] [name] can bathe himself independently.  [name] [name] is unable to operate a TV remote or a telephone.  [name] [name] is developing separation anxiety when his wife goes out to run errands.  [name] [name] no longer wants to travel, [name] [name] wants to stay in his own home.  [name] [name] is hard of hearing, [name] [name] may think that [name] doorbell is ringing and will have his wife check on it.  [name] [name] has recently started having troubles with visual hallucinations, [name] [name] may see people inside [name] house or outside of [name] house, [name] [name] sometimes will carry on a conversation with his hallucinations.  [name] episodes tend to occur in [name] evening when it tends to get dark.  [name]  [name] is not on any medications for memory.  [name] [name] recently has been placed on some Lexapro which has helped some of [name] agitation associated with [name] hallucinations.  [name] [name] has not demonstrated any violent behavior.  Given this recent behavioral change, [name] [name] is sent to this office for an evaluation.   REVIEW OF SYSTEMS: Out of a complete 14 system review of symptoms, [name] [name] complains only of [name] following symptoms, and all other reviewed systems are negative.  Memory loss, back pain, hearing loss  ALLERGIES: Allergies  Allergen Reactions  . Zocor [Simvastatin] Other (See Comments)    Weakness (legs)    HOME MEDICATIONS: Outpatient Medications Prior to Visit  Medication Sig Dispense Refill  . acetaminophen (TYLENOL) 500 MG tablet Take 1,000 mg by mouth See admin instructions. Take 1,000 mg by mouth in [name] morning and 1,000 mg at bedtime and an additional 1,000 mg at 3 PM daily as needed for back pain    . apixaban (ELIQUIS) 5 MG TABS tablet Take 1 tablet (5 mg total) by mouth 2 (two) times daily. 60 tablet 0  . diltiazem (CARDIZEM CD) 240 MG 24 hr capsule Take 1 capsule (240 mg total) by mouth daily. 30 capsule 0  . fluticasone (FLONASE) 50 MCG/ACT nasal spray Place 2 sprays into both nostrils daily as needed for allergies or rhinitis.     Marland Kitchen  levothyroxine (SYNTHROID, LEVOTHROID) 75 MCG tablet Take 75 mcg by mouth daily before breakfast.     . memantine (NAMENDA) 10 MG tablet Take 1 tablet (10 mg total) by mouth 2 (two) times daily. 180 tablet 1   No facility-administered medications prior to visit.     PAST MEDICAL HISTORY: Past Medical History:  Diagnosis Date  . Alzheimer disease ([place]) 12/17/2017  . Arthritis   . Atrial fibrillation with RVR ([place]) 06/2018  . Complication of anesthesia    had confusion after surg/  "OK following spinal anesth"  . History of kidney stones   . Hyperlipemia   . Hypertension   . Hypothyroidism   . Nocturia   . Normal  coronary arteries 2000  . Seasonal allergies   . Slow urinary stream   . Visual hallucinations 12/17/2017    PAST SURGICAL HISTORY: Past Surgical History:  Procedure Laterality Date  . CATARACT EXTRACTION     2015, 2017  . CORONARY ANGIOGRAPHY  2000   normal coronaries  . CYSTOSCOPY  2006   for kidney stone  . TONSILLECTOMY    . TOTAL KNEE ARTHROPLASTY  11/19/2011   Procedure: TOTAL KNEE ARTHROPLASTY;  Surgeon: Mauri Pole, MD;  Location: [facility];  Service: Orthopedics;  Laterality: Left;  . TOTAL KNEE ARTHROPLASTY Right 12/29/2012   Procedure: RIGHT TOTAL KNEE ARTHROPLASTY;  Surgeon: Mauri Pole, MD;  Location: [facility];  Service: Orthopedics;  Laterality: Right;    FAMILY HISTORY: History reviewed. No pertinent family history.  SOCIAL HISTORY: Social History   Socioeconomic History  . Marital status: Married    Spouse name: Not on file  . Number of children: 3  . Years of education: 80  . Highest education level: Not on file  Occupational History  . Occupation: Retired  Scientific laboratory technician  . Financial resource strain: Not on file  . Food insecurity:    Worry: Not on file    Inability: Not on file  . Transportation needs:    Medical: Not on file    Non-medical: Not on file  Tobacco Use  . Smoking status: Never Smoker  . Smokeless tobacco: Never Used  Substance and Sexual Activity  . Alcohol use: No  . Drug use: No  . Sexual activity: Not on file  Lifestyle  . Physical activity:    Days per week: Not on file    Minutes per session: Not on file  . Stress: Not on file  Relationships  . Social connections:    Talks on phone: Not on file    Gets together: Not on file    Attends religious service: Not on file    Active member of club or organization: Not on file    Attends meetings of clubs or organizations: Not on file    Relationship status: Not on file  . Intimate partner violence:    Fear of current or ex partner: Not on file    Emotionally abused: Not on  file    Physically abused: Not on file    Forced sexual activity: Not on file  Other Topics Concern  . Not on file  Social History Narrative   Lives with wife   Caffeine use: 1 cup coffee daily   Right handed       PHYSICAL EXAM  Vitals:   06/24/18 0810  BP: 140/68  Pulse: 67  Weight: 185 lb 6.4 oz (84.1 kg)  Height: 5\' 6"  (1.676 m)   Body mass index is 29.92  kg/m.   MMSE - Mini Mental State Exam 06/24/2018 12/17/2017  Not completed: (No Data) -  Orientation to time 0 0  Orientation to Place 3 4  Registration 3 3  Attention/ Calculation 0 1  Recall 0 1  Language- name 2 objects 2 2  Language- repeat 1 0  Language- follow 3 step command 2 3  Language- read & follow direction 1 1  Write a sentence 0 1  Copy design 0 0  Total score 12 16     Generalized: Well developed, in no acute distress   Neurological examination  Mentation: Alert oriented to time, place, history taking. Follows all commands speech and language fluent Cranial nerve II-XII: Pupils were equal round reactive to light. Extraocular movements were full, visual field were full on confrontational test. Facial sensation and strength were normal. Uvula tongue midline. Head turning and shoulder shrug  were normal and symmetric. Motor: [name] motor testing reveals 5 over 5 strength of all 4 extremities. Good symmetric motor tone is noted throughout.  Sensory: Sensory testing is intact to soft touch on all 4 extremities. No evidence of extinction is noted.  Coordination: Cerebellar testing reveals good finger-nose-finger and heel-to-shin bilaterally.  Gait and station: Gait is normal.  Reflexes: Deep tendon reflexes are symmetric and normal bilaterally.   DIAGNOSTIC DATA (LABS, IMAGING, TESTING) - I reviewed [name] records, labs, notes, testing and imaging myself where available.  Lab Results  Component Value Date   WBC 4.8 06/12/2018   HGB 13.3 06/12/2018   HCT 42.3 06/12/2018   MCV 98.6 06/12/2018    PLT 134 (L) 06/12/2018      Component Value Date/Time   NA 139 06/13/2018 0437   K 4.0 06/13/2018 0437   CL 107 06/13/2018 0437   CO2 22 06/13/2018 0437   GLUCOSE 118 (H) 06/13/2018 0437   BUN 14 06/13/2018 0437   CREATININE 1.30 (H) 06/13/2018 0437   CALCIUM 9.1 06/13/2018 0437   PROT 7.5 06/12/2018 1337   ALBUMIN 3.7 06/12/2018 1337   AST 19 06/12/2018 1337   ALT 12 06/12/2018 1337   ALKPHOS 54 06/12/2018 1337   BILITOT 0.6 06/12/2018 1337   GFRNONAA 47 (L) 06/13/2018 0437   GFRAA 55 (L) 06/13/2018 0437   Lab Results  Component Value Date   TSH 3.925 06/12/2018      ASSESSMENT AND PLAN 84 y.o. year old male  has a past medical history of Alzheimer disease ([place]) (12/17/2017), Arthritis, Atrial fibrillation with RVR ([place]) (44/3154), Complication of anesthesia, History of kidney stones, Hyperlipemia, Hypertension, Hypothyroidism, Nocturia, Normal coronary arteries (2000), Seasonal allergies, Slow urinary stream, and Visual hallucinations (12/17/2017). here with:  1.  Memory disturbance  [name] [name]'s memory score has declined slightly.  [name] [name] will continue on Namenda 10 mg twice a day.  I have advised that if his symptoms worsen or [name] [name] develops new symptoms they should let us know.  [name] [name] will follow-up in 6 months or sooner if needed.  I spent 15 minutes with [name] [name]. 50% of this time was spent reviewing [name] [name]'s memory score   Ward Givens, MSN, NP-C 06/24/2018, 8:41 AM [facility] [address] [place], [place] [address] [phone]

## 2018-06-24 NOTE — Patient Instructions (Addendum)
Medication Instructions:  Your physician has recommended you make the following change in your medication:   INCREASE: diltiazem to 360 mg once a day   If you need a refill on your cardiac medications before your next appointment, please call your pharmacy.   Lab work: TODAY: BMET  If you have labs (blood work) drawn today and your tests are completely normal, you will receive your results only by: Marland Kitchen MyChart Message (if you have MyChart) OR . A paper copy in the mail If you have any lab test that is abnormal or we need to change your treatment, we will call you to review the results.  Testing/Procedures: None ordered  Follow-Up: . Follow up with Dr. Acie Fredrickson on 07/13/18 at 11:40 AM  Any Other Special Instructions Will Be Listed Below (If Applicable).

## 2018-06-24 NOTE — Progress Notes (Signed)
Cardiology Office Note    Date:  06/24/2018   ID:  [name] [name], DOB 07-10-1922, MRN [id number]  PCP:  [name], MD  Cardiologist: [name], MD EPS: None  Chief Complaint  Patient presents with  . Hospitalization Follow-up    History of Present Illness:  [name] [name] is a 84 y.o. male with history of hypertension and Alzheimer's dementia was seen in the hospital for new onset atrial fibrillation with RVR.  CHA2DS2-VASc equals 3 started on Eliquis and diltiazem.  Family does not want any aggressive cardiac work-up.  Patient comes in for hospital f/u accompanied by his wife. He has done well without symptoms. Home health nurse says HR 80-110 and BP 527-782 systolic.     Past Medical History:  Diagnosis Date  . Alzheimer disease ([place]) 12/17/2017  . Arthritis   . Atrial fibrillation with RVR ([place]) 06/2018  . Complication of anesthesia    had confusion after surg/  "OK following spinal anesth"  . History of kidney stones   . Hyperlipemia   . Hypertension   . Hypothyroidism   . Nocturia   . Normal coronary arteries 2000  . Seasonal allergies   . Slow urinary stream   . Visual hallucinations 12/17/2017    Past Surgical History:  Procedure Laterality Date  . CATARACT EXTRACTION     2015, 2017  . CORONARY ANGIOGRAPHY  2000   normal coronaries  . CYSTOSCOPY  2006   for kidney stone  . TONSILLECTOMY    . TOTAL KNEE ARTHROPLASTY  11/19/2011   Procedure: TOTAL KNEE ARTHROPLASTY;  Surgeon: Mauri Pole, MD;  Location: [facility];  Service: Orthopedics;  Laterality: Left;  . TOTAL KNEE ARTHROPLASTY Right 12/29/2012   Procedure: RIGHT TOTAL KNEE ARTHROPLASTY;  Surgeon: Mauri Pole, MD;  Location: [facility];  Service: Orthopedics;  Laterality: Right;    Current Medications: Current Meds  Medication Sig  . acetaminophen (TYLENOL) 500 MG tablet Take 1,000 mg by mouth See admin instructions. Take 1,000 mg by mouth in the morning and 1,000 mg at bedtime and an  additional 1,000 mg at 3 PM daily as needed for back pain  . apixaban (ELIQUIS) 5 MG TABS tablet Take 1 tablet (5 mg total) by mouth 2 (two) times daily.  Marland Kitchen diltiazem (CARDIZEM CD) 360 MG 24 hr capsule Take 1 capsule (360 mg total) by mouth daily.  . fluticasone (FLONASE) 50 MCG/ACT nasal spray Place 2 sprays into both nostrils daily as needed for allergies or rhinitis.   Marland Kitchen levothyroxine (SYNTHROID, LEVOTHROID) 75 MCG tablet Take 75 mcg by mouth daily before breakfast.   . memantine (NAMENDA) 10 MG tablet Take 1 tablet (10 mg total) by mouth 2 (two) times daily.  . [DISCONTINUED] diltiazem (CARDIZEM CD) 240 MG 24 hr capsule Take 1 capsule (240 mg total) by mouth daily.     Allergies:   Zocor [simvastatin]   Social History   Socioeconomic History  . Marital status: Married    Spouse name: Not on file  . Number of children: 3  . Years of education: 81  . Highest education level: Not on file  Occupational History  . Occupation: Retired  Scientific laboratory technician  . Financial resource strain: Not on file  . Food insecurity:    Worry: Not on file    Inability: Not on file  . Transportation needs:    Medical: Not on file    Non-medical: Not on file  Tobacco Use  . Smoking  status: Never Smoker  . Smokeless tobacco: Never Used  Substance and Sexual Activity  . Alcohol use: No  . Drug use: No  . Sexual activity: Not on file  Lifestyle  . Physical activity:    Days per week: Not on file    Minutes per session: Not on file  . Stress: Not on file  Relationships  . Social connections:    Talks on phone: Not on file    Gets together: Not on file    Attends religious service: Not on file    Active member of club or organization: Not on file    Attends meetings of clubs or organizations: Not on file    Relationship status: Not on file  Other Topics Concern  . Not on file  Social History Narrative   Lives with wife   Caffeine use: 1 cup coffee daily   Right handed      Family History:  The  patient's   family history is not on file.   ROS:   Please see the history of present illness.    Review of Systems  Reason unable to perform ROS: alzheimers.   All other systems reviewed and are negative.   PHYSICAL EXAM:   VS:  BP 132/78   Pulse 73   Ht 5\' 6"  (1.676 m)   Wt 184 lb 12.8 oz (83.8 kg)   SpO2 97%   BMI 29.83 kg/m   Physical Exam  GEN: Well nourished, well developed, in no acute distress  Neck: no JVD, carotid bruits, or masses Cardiac:irreg irreg; no murmurs, rubs, or gallops  Respiratory:  clear to auscultation bilaterally, normal work of breathing GI: soft, nontender, nondistended, + BS Ext: without cyanosis, clubbing, or edema, Good distal pulses bilaterally Neuro:  Alert and Oriented x 3 Psych: euthymic mood, full affect  Wt Readings from Last 3 Encounters:  06/24/18 184 lb 12.8 oz (83.8 kg)  06/24/18 185 lb 6.4 oz (84.1 kg)  06/15/18 176 lb 9.6 oz (80.1 kg)      Studies/Labs Reviewed:   EKG:  EKG is not ordered today.    Recent Labs: 06/12/2018: ALT 12; Hemoglobin 13.3; Platelets 134; TSH 3.925 06/13/2018: BUN 14; Creatinine, Ser 1.30; Potassium 4.0; Sodium 139   Lipid Panel No results found for: CHOL, TRIG, HDL, CHOLHDL, VLDL, LDLCALC, LDLDIRECT  Additional studies/ records that were reviewed today include:  2Decho 06/14/18 Study Conclusions   - Left ventricle: The cavity size was normal. Wall thickness was   normal. Systolic function was normal. The estimated ejection   fraction was in the range of 55% to 60%. Wall motion was normal;   there were no regional wall motion abnormalities. The study was   not technically sufficient to allow evaluation of LV diastolic   dysfunction due to atrial fibrillation. - Aortic valve: Moderately calcified annulus. Trileaflet. There was   trivial regurgitation. - Mitral valve: Calcified annulus. There was mild regurgitation. - Left atrium: The atrium was moderately dilated. - Tricuspid valve: There was  mild regurgitation.       ASSESSMENT:    1. Atrial fibrillation with RVR ([place])   2. Essential hypertension      PLAN:  In order of problems listed above:  New onset atrial fibrillation with RVR in the hospital started on Eliquis and diltiazem.  CHA2DS2-VASc equals 3 we will keep an eye on his renal function.  2D echo with normal LV function, mild MR trivial aortic regurgitation left atrium was moderately dilated.  HR up to 111/m with a short walk in the office and home health is documented heart rates up to 110.  Patient did look short of breath in the office despite no complaints.  Will increase diltiazem to 360 mg daily.  Home health and palliative care will continue to monitor.  Check renal function today on Eliquis.  Essential hypertension blood pressure stable.  Medication Adjustments/Labs and Tests Ordered: Current medicines are reviewed at length with the patient today.  Concerns regarding medicines are outlined above.  Medication changes, Labs and Tests ordered today are listed in the Patient Instructions below. There are no Patient Instructions on file for this visit.   Sumner Boast, PA-C  06/24/2018 11:45 AM    [facility], [place], [place]  [address] Phone: [phone]; Fax: [phone]

## 2018-06-25 LAB — BASIC METABOLIC PANEL
BUN/Creatinine Ratio: 16 (ref 10–24)
BUN: 21 mg/dL (ref 10–36)
CHLORIDE: 105 mmol/L (ref 96–106)
CO2: 21 mmol/L (ref 20–29)
Calcium: 9.9 mg/dL (ref 8.6–10.2)
Creatinine, Ser: 1.31 mg/dL — ABNORMAL HIGH (ref 0.76–1.27)
GFR calc Af Amer: 54 mL/min/{1.73_m2} — ABNORMAL LOW (ref >59)
GFR calc non Af Amer: 47 mL/min/{1.73_m2} — ABNORMAL LOW (ref >59)
Glucose: 106 mg/dL — ABNORMAL HIGH (ref 65–99)
Potassium: 4.9 mmol/L (ref 3.5–5.2)
Sodium: 143 mmol/L (ref 134–144)

## 2018-07-13 ENCOUNTER — Ambulatory Visit: Payer: Medicare Other | Admitting: Cardiovascular Disease

## 2018-07-13 ENCOUNTER — Encounter: Payer: Self-pay | Admitting: Cardiovascular Disease

## 2018-07-13 VITALS — BP 100/70 | HR 66 | Ht 66.0 in | Wt 184.0 lb

## 2018-07-13 DIAGNOSIS — I4891 Unspecified atrial fibrillation: Secondary | ICD-10-CM

## 2018-07-13 MED ORDER — APIXABAN 5 MG PO TABS: 5.0000 mg | ORAL_TABLET | Freq: Two times a day (BID) | ORAL | 3 refills | Status: AC

## 2018-07-13 NOTE — Patient Instructions (Signed)

## 2018-07-13 NOTE — Progress Notes (Signed)
Cardiology Office Note:    Date:  07/13/2018   ID:  [name] [name], DOB 1922-12-18, MRN [id number]  PCP:  [name], MD  Cardiologist:  [name], MD  Electrophysiologist:  None   Referring MD: [name], MD   Chief Complaint  Patient presents with  . Atrial Fibrillation     Jan. 6, 2020   [name] [name] is a 85 y.o. male with a hx of atrial fibrillation that was diagnosed during a hospitalization. I met the patient in 2014 when I saw him in the hospital.  He was seen by [name] during hospitalization for which she was found to have atrial fibrillation. Started on Eliquis 5 mg twice a day.  Seems to be doing well No bleeding issues Had a UTI in November which caused slight UTI but none since.     Past Medical History:  Diagnosis Date  . Alzheimer disease ([place]) 12/17/2017  . Arthritis   . Atrial fibrillation with RVR ([place]) 06/2018  . Complication of anesthesia    had confusion after surg/  "OK following spinal anesth"  . History of kidney stones   . Hyperlipemia   . Hypertension   . Hypothyroidism   . Nocturia   . Normal coronary arteries 2000  . Seasonal allergies   . Slow urinary stream   . Visual hallucinations 12/17/2017    Past Surgical History:  Procedure Laterality Date  . CATARACT EXTRACTION     2015, 2017  . CORONARY ANGIOGRAPHY  2000   normal coronaries  . CYSTOSCOPY  2006   for kidney stone  . TONSILLECTOMY    . TOTAL KNEE ARTHROPLASTY  11/19/2011   Procedure: TOTAL KNEE ARTHROPLASTY;  Surgeon: Mauri Pole, MD;  Location: [facility];  Service: Orthopedics;  Laterality: Left;  . TOTAL KNEE ARTHROPLASTY Right 12/29/2012   Procedure: RIGHT TOTAL KNEE ARTHROPLASTY;  Surgeon: Mauri Pole, MD;  Location: [facility];  Service: Orthopedics;  Laterality: Right;    Current Medications: Current Meds  Medication Sig  . acetaminophen (TYLENOL) 500 MG tablet Take 1,000 mg by mouth See admin instructions. Take 1,000 mg by  mouth in the morning and 1,000 mg at bedtime and an additional 1,000 mg at 3 PM daily as needed for back pain  . apixaban (ELIQUIS) 5 MG TABS tablet Take 1 tablet (5 mg total) by mouth 2 (two) times daily.  Marland Kitchen diltiazem (CARDIZEM CD) 360 MG 24 hr capsule Take 1 capsule (360 mg total) by mouth daily.  . fluticasone (FLONASE) 50 MCG/ACT nasal spray Place 2 sprays into both nostrils daily as needed for allergies or rhinitis.   Marland Kitchen levothyroxine (SYNTHROID, LEVOTHROID) 75 MCG tablet Take 75 mcg by mouth daily before breakfast.   . memantine (NAMENDA) 10 MG tablet Take 1 tablet (10 mg total) by mouth 2 (two) times daily.  . [DISCONTINUED] apixaban (ELIQUIS) 5 MG TABS tablet Take 1 tablet (5 mg total) by mouth 2 (two) times daily.     Allergies:   Zocor [simvastatin]   Social History   Socioeconomic History  . Marital status: Married    Spouse name: Not on file  . Number of children: 3  . Years of education: 57  . Highest education level: Not on file  Occupational History  . Occupation: Retired  Scientific laboratory technician  . Financial resource strain: Not on file  . Food insecurity:    Worry: Not on file    Inability: Not on file  . Transportation  needs:    Medical: Not on file    Non-medical: Not on file  Tobacco Use  . Smoking status: Never Smoker  . Smokeless tobacco: Never Used  Substance and Sexual Activity  . Alcohol use: No  . Drug use: No  . Sexual activity: Not on file  Lifestyle  . Physical activity:    Days per week: Not on file    Minutes per session: Not on file  . Stress: Not on file  Relationships  . Social connections:    Talks on phone: Not on file    Gets together: Not on file    Attends religious service: Not on file    Active member of club or organization: Not on file    Attends meetings of clubs or organizations: Not on file    Relationship status: Not on file  Other Topics Concern  . Not on file  Social History Narrative   Lives with wife   Caffeine use: 1 cup  coffee daily   Right handed      Family History: The patient's family history includes Diabetes in his brother.  ROS:   Please see the history of present illness.     All other systems reviewed and are negative.  EKGs/Labs/Other Studies Reviewed:       EKG:    Recent Labs: 06/12/2018: ALT 12; Hemoglobin 13.3; Platelets 134; TSH 3.925 06/24/2018: BUN 21; Creatinine, Ser 1.31; Potassium 4.9; Sodium 143  Recent Lipid Panel No results found for: CHOL, TRIG, HDL, CHOLHDL, VLDL, LDLCALC, LDLDIRECT  Physical Exam:    VS:  BP 100/70   Pulse 66   Ht '5\' 6"'$  (1.676 m)   Wt 184 lb (83.5 kg)   SpO2 97%   BMI 29.70 kg/m     Wt Readings from Last 3 Encounters:  07/13/18 184 lb (83.5 kg)  06/24/18 184 lb 12.8 oz (83.8 kg)  06/24/18 185 lb 6.4 oz (84.1 kg)     GEN:  Elderly male,  Demented.  HEENT: Normal NECK: No JVD; No carotid bruits LYMPHATICS: No lymphadenopathy CARDIAC: Irreg. Irreg.  RESPIRATORY:  Clear to auscultation without rales, wheezing or rhonchi  ABDOMEN: Soft, non-tender, non-distended MUSCULOSKELETAL:  No edema; No deformity  SKIN: Warm and dry NEUROLOGIC:  Alert and oriented x 3 PSYCHIATRIC:  Normal affect   ASSESSMENT:    No diagnosis found. PLAN:    In order of problems listed above:  1.   Persistent Atrial fib:  CHADS2VASC is 2 ( age)  Doing well . We discussed Atrial fib and the need for anticoagulation.  His BP and HR are well controlled.   Routine lab work looks stable. I will see him again in 6 months for follow-up visit. He will see Dr. Osborne Casco regularly    Medication Adjustments/Labs and Tests Ordered: Current medicines are reviewed at length with the patient today.  Concerns regarding medicines are outlined above.  No orders of the defined types were placed in this encounter.  Meds ordered this encounter  Medications  . apixaban (ELIQUIS) 5 MG TABS tablet    Sig: Take 1 tablet (5 mg total) by mouth 2 (two) times daily.    Dispense:   180 tablet    Refill:  3    Patient Instructions  Medication Instructions:  Your physician recommends that you continue on your current medications as directed. Please refer to the Current Medication list given to you today.  If you need a refill on your cardiac medications before your  next appointment, please call your pharmacy.    Lab work: None Ordered   Testing/Procedures: None Ordered    Follow-Up: At [facility], you and your health needs are our priority.  As part of our continuing mission to provide you with exceptional heart care, we have created designated Provider Care Teams.  These Care Teams include your primary Cardiologist (physician) and Advanced Practice Providers (APPs -  Physician Assistants and Nurse Practitioners) who all work together to provide you with the care you need, when you need it. You will need a follow up appointment in:  6 months.  Please call our office 2 months in advance to schedule this appointment.  You may see [name], MD or one of the following Advanced Practice Providers on your designated Care Team: Richardson Dopp, PA-C [place], [place] . Daune Perch, NP       Signed, [name], MD  07/13/2018 12:48 PM    [facility]

## 2018-08-05 ENCOUNTER — Telehealth: Payer: Self-pay | Admitting: Cardiovascular Disease

## 2018-08-05 NOTE — Telephone Encounter (Signed)
Routing to Triage as Kelby Aline RN is out of the office.

## 2018-08-05 NOTE — Telephone Encounter (Signed)
Spoke with pt's wife and a  couple of weeks ago pt's heart rate was running around 120 no other symptoms  Rates have been varying sometimes 60 -70 or running in the 80's Today while nurse was visiting heart rate was 90-95  Pt's wife just had concerns how high was to high Informed normal range is 60 -100 depending on activity Will forward to Dr Acie Fredrickson for review and recommendations . Per wife pt feels fine/cy

## 2018-08-05 NOTE — Telephone Encounter (Signed)
Patient c/o Palpitations:  High priority if patient c/o lightheadedness, shortness of breath, or chest pain  1) How long have you had palpitations/irregular HR/ Afib? no  Are you having the symptoms now? no  2) Are you currently experiencing lightheadedness, SOB or CP? no  3) Do you have a history of afib (atrial fibrillation) or irregular heart rhythm? yes  4) Have you checked your BP or HR? (document readings if available): nurse stated when she was there it went from 66 to 98  5) Are you experiencing any other symptoms? no  Nurse stated the wife is concerned with what to expect, since this all new to her and she is not sure on what to do.  Husband was recently diagnosed with this at the end of last year.  She wants to know at what point should she take him to the hospital, when his heart shows a certain reading at what numbers.

## 2018-08-06 NOTE — Telephone Encounter (Signed)
HR has now improved.  Will discuss at his next office visit

## 2018-08-07 NOTE — Telephone Encounter (Addendum)
Spoke with patient's wife to give her reassurance about varying pulse rates. Wife states patient's pulse has been 85 bpm the past 2 days. I advised her that there will be variations in every person's pulse and that the way the patient is feeling should give her the indication to check the pulse. I advised her to call back with questions or concerns and she thanked me for the call.

## 2018-11-07 ENCOUNTER — Emergency Department (HOSPITAL_COMMUNITY)

## 2018-11-07 ENCOUNTER — Emergency Department (HOSPITAL_COMMUNITY)
Admission: EM | Admit: 2018-11-07 | Discharge: 2018-11-07 | Disposition: A | Discharge status: Home / Self Care | Attending: Emergency Medicine | Admitting: Emergency Medicine

## 2018-11-07 ENCOUNTER — Encounter (HOSPITAL_COMMUNITY): Payer: Self-pay | Admitting: *Deleted

## 2018-11-07 ENCOUNTER — Other Ambulatory Visit: Payer: Self-pay

## 2018-11-07 DIAGNOSIS — G309 Alzheimer's disease, unspecified: Secondary | ICD-10-CM | POA: Diagnosis not present

## 2018-11-07 DIAGNOSIS — Y999 Unspecified external cause status: Secondary | ICD-10-CM | POA: Insufficient documentation

## 2018-11-07 DIAGNOSIS — Z96653 Presence of artificial knee joint, bilateral: Secondary | ICD-10-CM | POA: Insufficient documentation

## 2018-11-07 DIAGNOSIS — S022XXA Fracture of nasal bones, initial encounter for closed fracture: Secondary | ICD-10-CM | POA: Diagnosis not present

## 2018-11-07 DIAGNOSIS — Y929 Unspecified place or not applicable: Secondary | ICD-10-CM | POA: Insufficient documentation

## 2018-11-07 DIAGNOSIS — Z79899 Other long term (current) drug therapy: Secondary | ICD-10-CM | POA: Diagnosis not present

## 2018-11-07 DIAGNOSIS — W19XXXA Unspecified fall, initial encounter: Secondary | ICD-10-CM | POA: Insufficient documentation

## 2018-11-07 DIAGNOSIS — I10 Essential (primary) hypertension: Secondary | ICD-10-CM | POA: Diagnosis not present

## 2018-11-07 DIAGNOSIS — Z7901 Long term (current) use of anticoagulants: Secondary | ICD-10-CM | POA: Insufficient documentation

## 2018-11-07 DIAGNOSIS — E039 Hypothyroidism, unspecified: Secondary | ICD-10-CM | POA: Diagnosis not present

## 2018-11-07 DIAGNOSIS — S0990XA Unspecified injury of head, initial encounter: Secondary | ICD-10-CM | POA: Diagnosis present

## 2018-11-07 DIAGNOSIS — Y939 Activity, unspecified: Secondary | ICD-10-CM | POA: Diagnosis not present

## 2018-11-07 LAB — CBC
HCT: 38.9 % — ABNORMAL LOW (ref 39.0–52.0)
Hemoglobin: 12.4 g/dL — ABNORMAL LOW (ref 13.0–17.0)
MCH: 32.2 pg (ref 26.0–34.0)
MCHC: 31.9 g/dL (ref 30.0–36.0)
MCV: 101 fL — ABNORMAL HIGH (ref 80.0–100.0)
Platelets: 137 10*3/uL — ABNORMAL LOW (ref 150–400)
RBC: 3.85 MIL/uL — ABNORMAL LOW (ref 4.22–5.81)
RDW: 13.3 % (ref 11.5–15.5)
WBC: 8.1 10*3/uL (ref 4.0–10.5)
nRBC: 0 % (ref 0.0–0.2)

## 2018-11-07 LAB — BASIC METABOLIC PANEL
Anion gap: 16 — ABNORMAL HIGH (ref 5–15)
BUN: 22 mg/dL (ref 8–23)
CO2: 16 mmol/L — ABNORMAL LOW (ref 22–32)
Calcium: 9.4 mg/dL (ref 8.9–10.3)
Chloride: 110 mmol/L (ref 98–111)
Creatinine, Ser: 1.39 mg/dL — ABNORMAL HIGH (ref 0.61–1.24)
GFR calc Af Amer: 50 mL/min — ABNORMAL LOW (ref >60)
GFR calc non Af Amer: 43 mL/min — ABNORMAL LOW (ref >60)
Glucose, Bld: 136 mg/dL — ABNORMAL HIGH (ref 70–99)
Potassium: 4.1 mmol/L (ref 3.5–5.1)
Sodium: 142 mmol/L (ref 135–145)

## 2018-11-07 LAB — CK: Total CK: 86 U/L (ref 49–397)

## 2018-11-07 MED ORDER — CEPHALEXIN 500 MG PO CAPS: 500.0000 mg | ORAL_CAPSULE | Freq: Two times a day (BID) | ORAL | 0 refills | Status: AC

## 2018-11-07 MED ORDER — SODIUM CHLORIDE 0.9 % IV BOLUS
1000.0000 mL | Freq: Once | INTRAVENOUS | Status: AC
  Administered 2018-11-07: 1000 mL via INTRAVENOUS

## 2018-11-07 NOTE — ED Notes (Signed)
Bear hugger blanket applied

## 2018-11-07 NOTE — Discharge Instructions (Signed)
Your CT scans of your head and neck show no bleeding on your brain.  There is no fracture of your neck.  You do have a fracture of your nasal bone.  You will need to follow-up with the ear nose and throat specialist for this.  Please see is contact information, which is attached.  Please take the antibiotics as prescribed.  Please return for new or worsening symptoms.

## 2018-11-07 NOTE — ED Notes (Signed)
Pt's wife given discharge instructions and follow up information. Pt's prescription discussed with the patient and wife. Pt and patient's wife verbalized understanding. Given the opportunity to ask questions. Pt discharged from the ED without incident.

## 2018-11-07 NOTE — ED Notes (Signed)
Called and spoke with patients wife update on patient status given. She will be coming to pick him up and will be bringing clothes for him.

## 2018-11-07 NOTE — ED Triage Notes (Signed)
Patient presents to ed via [facility] states patient has a history of Alzheimers and has a history of wondering, unknown how long patiant had been outside. Dried blood on his face. Abrasion to his nose small lac. To the bridge of his nose and avuslion to area between eyebrows. Per ems son states patient is at his normal mental status. Multiple small abasions and skin tears to bothe hands. Abrasions to both knees , bleeding controled.

## 2018-11-07 NOTE — ED Notes (Signed)
Rob PA aware of current b/p warm Normal Saline given.

## 2018-11-07 NOTE — ED Notes (Signed)
pts wife called about 30 mins ago looking for pt but was not here yet, would like a call when possible with an update at [phone]

## 2018-11-07 NOTE — ED Provider Notes (Signed)
[facility] Provider Note   CSN: [id number] Arrival date & time: 11/07/18  0335    History   Chief Complaint No chief complaint on file.   HPI [name] [name] is a 85 y.o. male.     Patient presents to the emergency department with a chief complaint of fall.  Patient has history of Alzheimer's disease, A. fib, is anticoagulated on Eliquis, lives at home with his wife, who is his caretaker.  Reportedly found down by a neighbor.  EMS was called.  Patient has abrasions on his face and complains of neck pain.  He denies any other symptoms.  Level 5 caveat secondary to dementia.  The history is provided by the patient. No language interpreter was used.    Past Medical History:  Diagnosis Date  . Alzheimer disease ([place]) 12/17/2017  . Arthritis   . Atrial fibrillation with RVR ([place]) 06/2018  . Complication of anesthesia    had confusion after surg/  "OK following spinal anesth"  . History of kidney stones   . Hyperlipemia   . Hypertension   . Hypothyroidism   . Nocturia   . Normal coronary arteries 2000  . Seasonal allergies   . Slow urinary stream   . Visual hallucinations 12/17/2017    Patient Active Problem List   Diagnosis Date Noted  . Goals of care, counseling/discussion   . Palliative care encounter   . CRI (chronic renal insufficiency), stage 3 (moderate) ([place]) 06/13/2018  . Atrial fibrillation with RVR ([place]) 06/12/2018  . Alzheimer disease ([place]) 12/17/2017  . Visual hallucinations 12/17/2017  . Expected blood loss anemia 12/30/2012  . Overweight (BMI 25.0-29.9) 12/30/2012  . S/P right TKA 12/29/2012  . COLONIC POLYPS 09/03/2007  . HYPERLIPIDEMIA 09/03/2007  . Essential hypertension 09/03/2007  . ALLERGIC RHINITIS 09/03/2007  . DIVERTICULOSIS OF COLON 09/03/2007  . RENAL CALCULUS 09/03/2007  . ARTHRITIS 09/03/2007  . Normal coronary arteries 07/08/1998    Past Surgical History:  Procedure Laterality Date  . CATARACT  EXTRACTION     2015, 2017  . CORONARY ANGIOGRAPHY  2000   normal coronaries  . CYSTOSCOPY  2006   for kidney stone  . TONSILLECTOMY    . TOTAL KNEE ARTHROPLASTY  11/19/2011   Procedure: TOTAL KNEE ARTHROPLASTY;  Surgeon: Mauri Pole, MD;  Location: [facility];  Service: Orthopedics;  Laterality: Left;  . TOTAL KNEE ARTHROPLASTY Right 12/29/2012   Procedure: RIGHT TOTAL KNEE ARTHROPLASTY;  Surgeon: Mauri Pole, MD;  Location: [facility];  Service: Orthopedics;  Laterality: Right;        Home Medications    Prior to Admission medications   Medication Sig Start Date End Date Taking? Authorizing Provider  acetaminophen (TYLENOL) 500 MG tablet Take 1,000 mg by mouth See admin instructions. Take 1,000 mg by mouth in the morning and 1,000 mg at bedtime and an additional 1,000 mg at 3 PM daily as needed for back pain    [provider]  apixaban (ELIQUIS) 5 MG TABS tablet Take 1 tablet (5 mg total) by mouth 2 (two) times daily. 07/13/18   Nahser, Wonda Cheng, MD  diltiazem (CARDIZEM CD) 360 MG 24 hr capsule Take 1 capsule (360 mg total) by mouth daily. 06/24/18   Imogene Burn, PA-C  fluticasone (FLONASE) 50 MCG/ACT nasal spray Place 2 sprays into both nostrils daily as needed for allergies or rhinitis.  05/21/18   [provider]  levothyroxine (SYNTHROID, LEVOTHROID) 75 MCG tablet Take 75  mcg by mouth daily before breakfast.     [provider]  memantine (NAMENDA) 10 MG tablet Take 1 tablet (10 mg total) by mouth 2 (two) times daily. 06/24/18   Ward Givens, NP    Family History Family History  Problem Relation Age of Onset  . Diabetes Brother     Social History Social History   Tobacco Use  . Smoking status: Never Smoker  . Smokeless tobacco: Never Used  Substance Use Topics  . Alcohol use: No  . Drug use: No     Allergies   Zocor [simvastatin]   Review of Systems Review of Systems  Unable to perform ROS: Dementia     Physical Exam Updated  Vital Signs There were no vitals taken for this visit.  Physical Exam Vitals signs and nursing note reviewed.  Constitutional:      Appearance: He is well-developed.  HENT:     Head: Normocephalic and atraumatic.     Comments: Abrasions to forehead 72mm puncture wound near left eyebrow, no FB    Nose:     Comments: Abrasions on nose, shallow laceration to the bridge of the nose No septal hematoma Eyes:     Conjunctiva/sclera: Conjunctivae normal.  Neck:     Musculoskeletal: Neck supple.  Cardiovascular:     Rate and Rhythm: Normal rate and regular rhythm.     Heart sounds: No murmur.  Pulmonary:     Effort: Pulmonary effort is normal. No respiratory distress.     Breath sounds: Normal breath sounds.  Abdominal:     Palpations: Abdomen is soft.     Tenderness: There is no abdominal tenderness.  Musculoskeletal: Normal range of motion.     Comments: Range of motion and strength of bilateral upper and lower extremities is 5/5, no bony abnormality or deformity  Skin:    General: Skin is warm and dry.     Comments: Abrasions to bilateral knees  Neurological:     Mental Status: He is alert and oriented to person, place, and time.  Psychiatric:        Mood and Affect: Mood normal.      ED Treatments / Results  Labs (all labs ordered are listed, but only abnormal results are displayed) Labs Reviewed - No data to display  EKG None  Radiology No results found.  Procedures Procedures (including critical care time) LACERATION REPAIR Performed by: Montine Circle Authorized by: Montine Circle Consent: Verbal consent obtained. Risks and benefits: risks, benefits and alternatives were discussed Consent given by: patient Patient identity confirmed: provided demographic data Prepped and Draped in normal sterile fashion Wound explored  Laceration Location: forehead  Laceration Length: 62mm  No Foreign Bodies seen or palpated  Anesthesia: none  Local anesthetic:  none  Anesthetic total: none  Irrigation method: syringe Amount of cleaning: standard  Skin closure: dermabond  Number of sutures: dermabond  Technique: dermabond  Patient tolerance: Patient tolerated the procedure well with no immediate complications.  LACERATION REPAIR Performed by: Montine Circle Authorized by: Montine Circle Consent: Verbal consent obtained. Risks and benefits: risks, benefits and alternatives were discussed Consent given by: patient Patient identity confirmed: provided demographic data Prepped and Draped in normal sterile fashion Wound explored  Laceration Location: nose  Laceration Length: 30mm  No Foreign Bodies seen or palpated  Anesthesia: none  Local anesthetic: none  Anesthetic total: none  Irrigation method: syringe Amount of cleaning: standard  Skin closure: dermabond  Number of sutures: dermabond  Technique: dermabond  Patient tolerance: Patient tolerated the procedure well with no immediate complications. Medications Ordered in ED Medications - No data to display   Initial Impression / Assessment and Plan / ED Course  I have reviewed the triage vital signs and the nursing notes.  Pertinent labs & imaging results that were available during my care of the patient were reviewed by me and considered in my medical decision making (see chart for details).        Patient with fall tonight.  Found by neighbor down outside.  Initially mildly hypothermic, but now is normothermic after [facility].  I doubt sepsis or infection.  Believe this to be exposure.  CT imaging is reassuring, but does show nasal bone fractures.  Patient will need to follow-up with ENT, I have provided him with contact information for him and his caregiver.  Will discharge patient on Keflex.  Patient had 2 very small shallow lacerations, which were closed with Dermabond.  Patient seen by and discussed with Dr. Randal Buba, who agrees with the plan.  Final  Clinical Impressions(s) / ED Diagnoses   Final diagnoses:  Fall, initial encounter  Injury of head, initial encounter  Closed fracture of nasal bone, initial encounter    ED Discharge Orders    None       Montine Circle, PA-C 11/07/18 [place], April, MD 11/07/18 [place], April, MD 11/07/18 [id number]

## 2018-12-23 ENCOUNTER — Telehealth: Payer: Self-pay

## 2018-12-23 NOTE — Telephone Encounter (Signed)
Spoke with the patient's wife and she has given verbal consent to file insurance and to do a mychart video visit. Mychart video link has been sent to the patient.    She has also mentioned that her husband is now under hospice care and that his memory has declined rapidly. She mentioned that he has issues hearing. He also cant shower by himself anymore but he is able to do other ADLs.

## 2018-12-24 ENCOUNTER — Telehealth: Payer: Self-pay | Admitting: Neurology

## 2018-12-24 NOTE — Telephone Encounter (Signed)
Noted! Thank you

## 2018-12-24 NOTE — Telephone Encounter (Addendum)
Pt wife has called back stating she was under the impression that she was getting a call.  Pt does not have a laptop, tablet or a smart phone. Phone rep was advised by NP Megan ok to schedule pt as a telephone visit. Pt wife has agreed to a Tele Visit for 06-22 @ 3:45 on house# [phone]  This is a Pharmacist, hospital

## 2018-12-24 NOTE — Telephone Encounter (Signed)
I had a VV scheduled with [name] [name] 12/24/2018. They did not log into the My Chart for the visit. I called twice and left a message, asking for them to call back to reschedule.

## 2018-12-28 ENCOUNTER — Ambulatory Visit (INDEPENDENT_AMBULATORY_CARE_PROVIDER_SITE_OTHER): Payer: Medicare Other | Admitting: Neurology

## 2018-12-28 ENCOUNTER — Encounter: Payer: Self-pay | Admitting: Neurology

## 2018-12-28 ENCOUNTER — Other Ambulatory Visit: Payer: Self-pay

## 2018-12-28 DIAGNOSIS — F0280 Dementia in other diseases classified elsewhere without behavioral disturbance: Secondary | ICD-10-CM | POA: Diagnosis not present

## 2018-12-28 DIAGNOSIS — G301 Alzheimer's disease with late onset: Secondary | ICD-10-CM

## 2018-12-28 NOTE — Progress Notes (Signed)
I have read the note, and I agree with the clinical assessment and plan.  Charles K Willis   

## 2018-12-28 NOTE — Progress Notes (Signed)
Virtual Visit via Telephone Note  I connected with [name] [name] on 12/28/18 at  3:45 PM EDT by telephone and verified that I am speaking with the correct person using two identifiers.   I discussed the limitations, risks, security and privacy concerns of performing an evaluation and management service by telephone and the availability of in person appointments. I also discussed with the patient that there may be a patient responsible charge related to this service. The patient expressed understanding and agreed to proceed.   History of Present Illness: 12/28/2018 SS: [name] [name] is a 85 year old male with history of memory disturbance.  He remains on Namenda 10 mg twice daily.  His wife indicates that he is now under hospice care and his memory has declined rapidly.  He requires assistance for showering, but is able to perform other ADLs.  In May 2020, went to the ER after being found down outside by a neighbor.  He was mildly hypothermic, CT showed nasal bone fractures.  He has history of A. fib, on Eliquis. At his last visit his MMSE in December 2019 was 12/30. His wife believes the [name] has helped. He is able to walk, dress himself, and feed himself. He go into the kitchen and get a snack. Is under hospice care for heart condition. She feels things are stabilizing some. They have home health nurses that come 5 nights a week during the evening. He was having hallucinations in the evening, but these stopped when the Ativan was discontinued. For 1 week, he has been on gabapentin 100 mg at bedtime and that has been helping for sleep. She has an alarm system for safety. He also tried Haldol and that made him worse.   06/24/2018 MM: [name] [name] is a 85 year old male with a history of memory disturbance.  He returns today for follow-up.  He is currently on Namenda 10 mg twice a day.  He is tolerating this medication well.  Wife reports that he had a UTI approximately 5 weeks ago.  She reports that  his cognition was slightly worse but is starting to level out.  She states that approximately 2 weeks ago he had an episode and was found ER in A. fib and was started on Eliquis.  He sees his cardiologist today.  He is able to complete all ADLs independently.  Denies any trouble sleeping.  Reports good appetite.  Denies hallucinations.  He returns today for an evaluation.   Observations/Objective: Telephone call, is alert, unable to hear well, cannot provide the date  Assessment and Plan: 1. Memory disturbance  He will continue taking Namenda 10 mg twice daily. His wife feels the [name] has been beneficial to continue to allow him to perform some ADLs.  I was not able to perform a [place], because he could not hear me. His last MMSE was 12/30.  I have encouraged the wife to continue home health and I think this is best for their safety.  Hospice has been working to help with the nighttime agitation/hallucinations.  He has tried Ativan and Haldol but these have worsened the situation.  They recently started gabapentin 100 mg at bedtime.  Over the last week, this has worked well.  They will continue to work with hospice and primary care for ongoing management of his memory issues.  They will follow-up with [place] on an as-needed basis.   Follow Up Instructions: Will follow-up with primary care and Hospice, follow-up on as needed basis  I discussed the assessment and treatment plan with the patient. The patient was provided an opportunity to ask questions and all were answered. The patient agreed with the plan and demonstrated an understanding of the instructions.   The patient was advised to call back or seek an in-person evaluation if the symptoms worsen or if the condition fails to improve as anticipated.  I provided 15 minutes of non-face-to-face time during this encounter.   Evangeline Dakin, DNP  [facility] [address], [place], [place] [address] [phone]

## 2019-01-07 ENCOUNTER — Other Ambulatory Visit: Payer: Self-pay | Admitting: Neurology

## 2019-04-08 DEATH — deceased

## 2019-07-25 IMAGING — CT CT HEAD WITHOUT CONTRAST
5 of 14 series · 14 of 47 positions shown, 15 images · non-contrast
Comparison: None.

CLINICAL DATA: History of Alzheimer's and wandering. Dried blood on
face with abrasion to nose.

EXAM:
CT HEAD WITHOUT CONTRAST
CT MAXILLOFACIAL WITHOUT CONTRAST
CT CERVICAL SPINE WITHOUT CONTRAST
TECHNIQUE: Multidetector CT imaging of the head, cervical spine, and
maxillofacial structures were performed using the standard protocol
without intravenous contrast. Multiplanar CT image reconstructions
of the cervical spine and maxillofacial structures were also
generated.

[Series 5: head bone · axial · 0.42mm/px · z∈[+962,+1046]mm · 3 of 84 slices shown]
[im 21/84  bone]
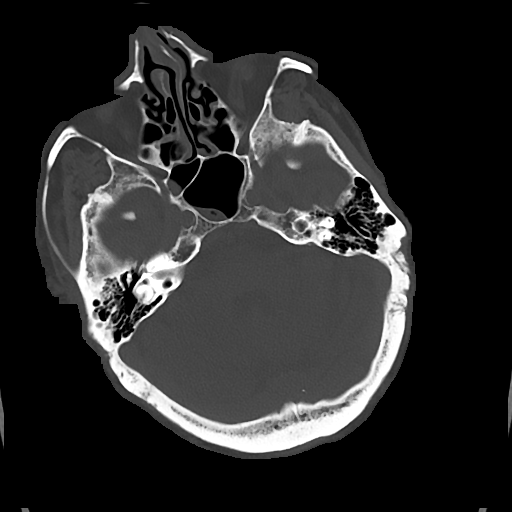
[im 42/84  bone]
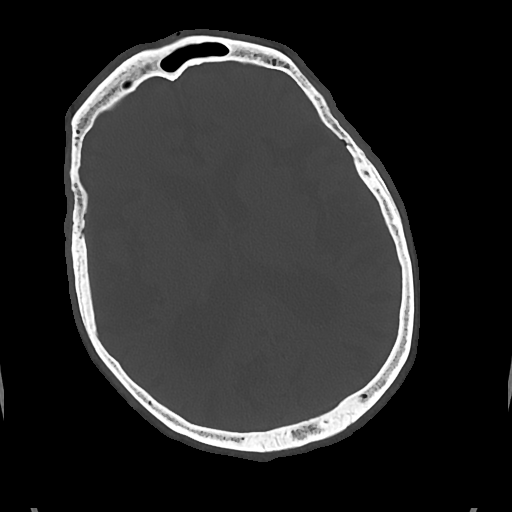
[im 63/84  bone]
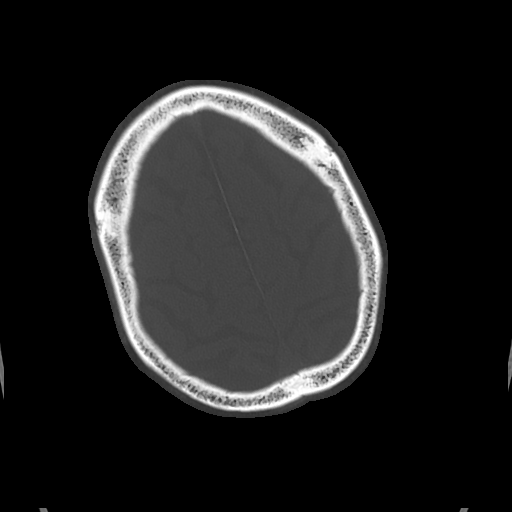

[Series 8: maxilllofacial 2.0 hr40 3 · axial · 0.35mm/px · z∈[+882,+976]mm · 3 of 95 slices shown, 4 images]
[im 24/95  brain]
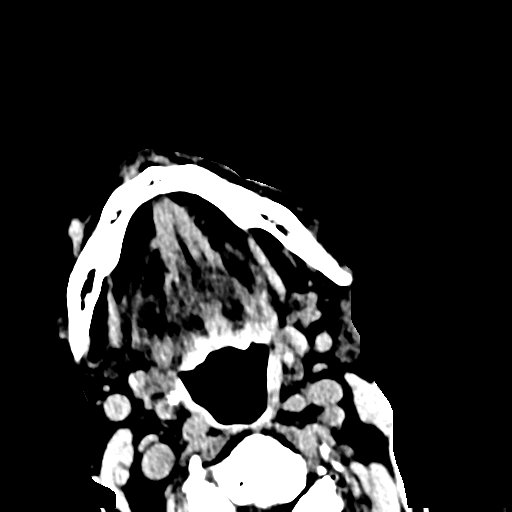
[im 24/95  bone]
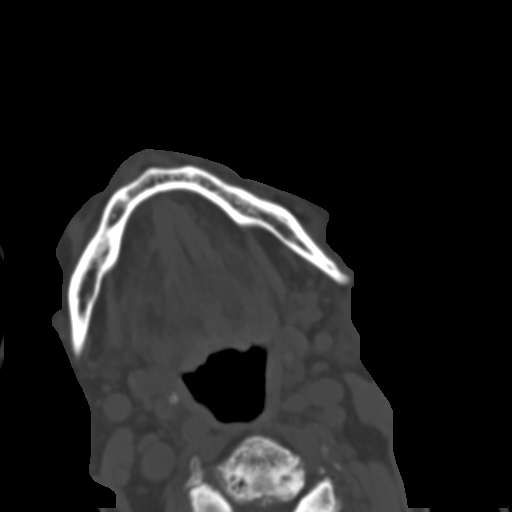
[im 48/95  brain]
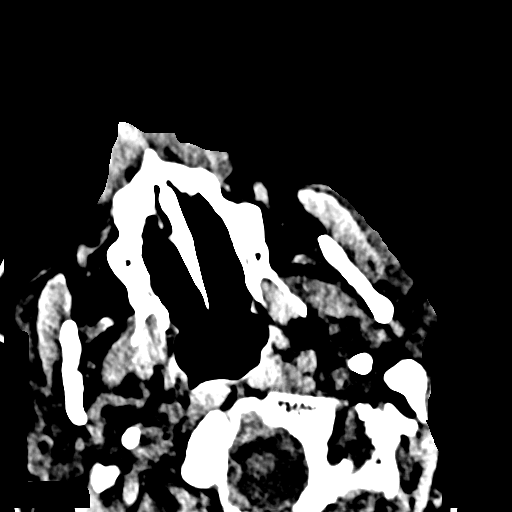
[im 71/95  brain]
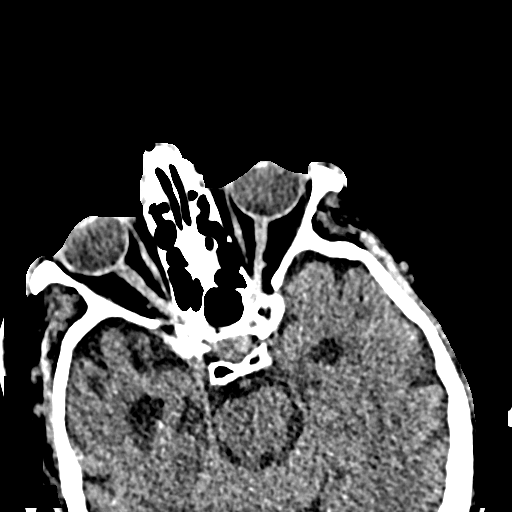

[Series 10: maxilllofacial 2.0 hr59 3 · axial · 0.35mm/px · z∈[+882,+976]mm · 3 of 95 slices shown]
[im 24/95  brain]
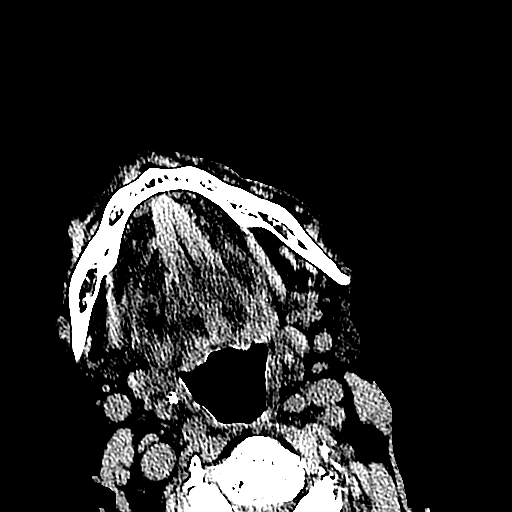
[im 48/95  brain]
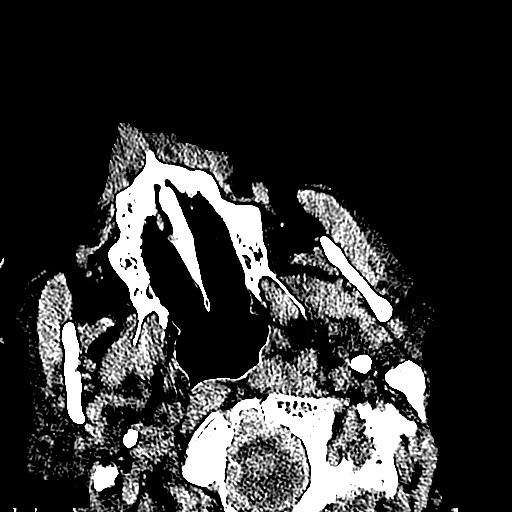
[im 71/95  brain]
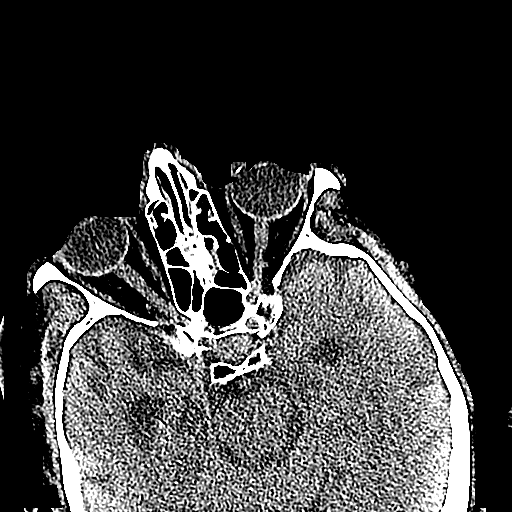

[Series 12: st cor · coronal · 0.37mm/px · 1 of 102 slices shown]
[im 51/102  brain]
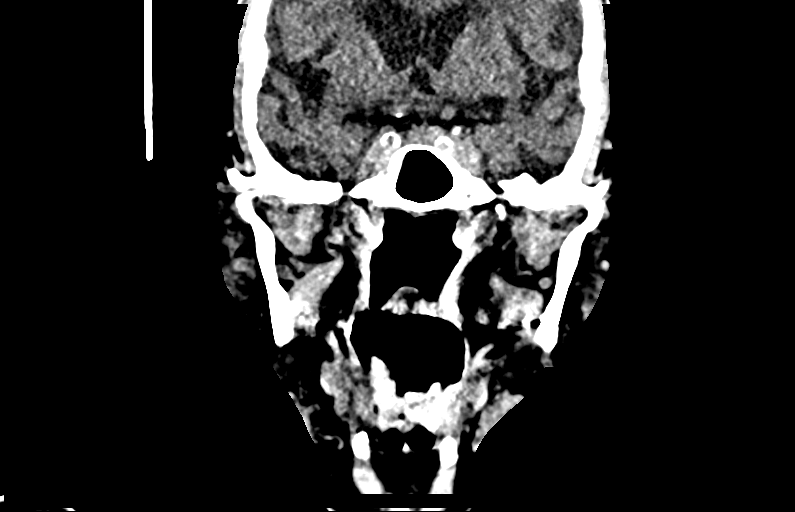

[Series 20: orthogonal axials · axial · 0.21mm/px · z∈[+778,+883]mm · 4 of 113 slices shown]
[im 23/113  brain]
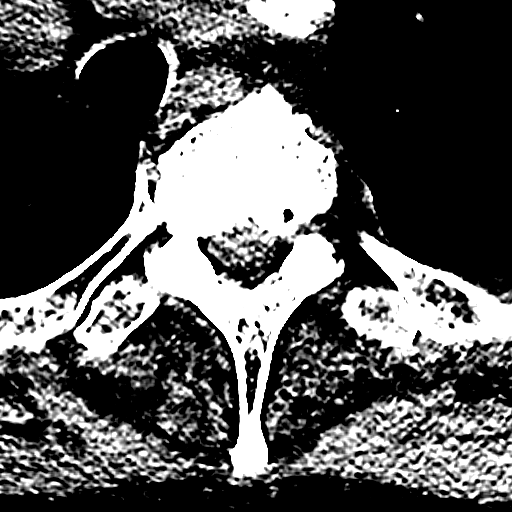
[im 45/113  brain]
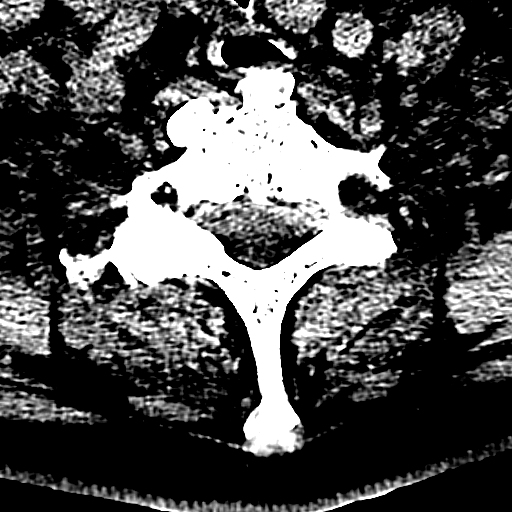
[im 68/113  brain]
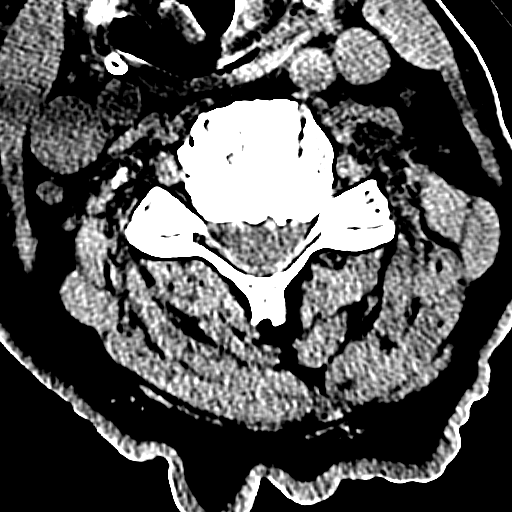
[im 90/113  brain]
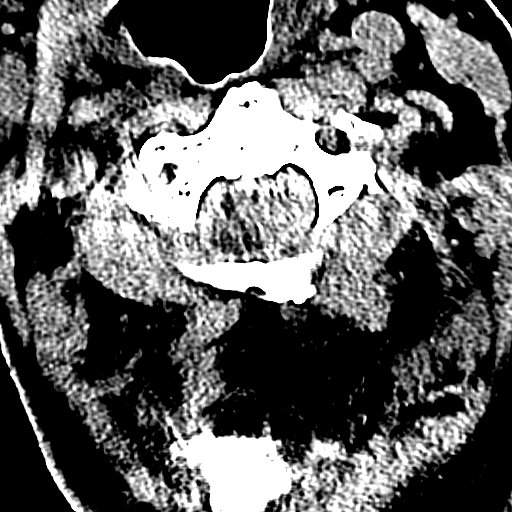

[14 of 47 positions shown; findings below may reference images not displayed]

FINDINGS: CT HEAD FINDINGS

Brain: No evidence of acute infarction, hemorrhage, hydrocephalus,
extra-axial collection or mass lesion/mass effect. Old tiny lacunar
infarct in the left cerebellum. Moderate generalized cerebral
atrophy. Scattered mild periventricular and subcortical white matter
hypodensities are nonspecific, but favored to reflect chronic
microvascular ischemic changes.

Vascular: Calcified atherosclerosis at the skullbase. No hyperdense
vessel.

Skull: Normal. Negative for fracture or focal lesion.

Other: Small forehead scalp laceration.

CT MAXILLOFACIAL FINDINGS

Osseous: Bilateral nasal bone fractures, minimally depressed on the
left. No additional fracture. No mandibular dislocation or
destructive process.

Orbits: Negative. No traumatic or inflammatory finding.

Sinuses: Scattered mild paranasal sinus mucosal thickening. No
air-fluid levels. The mastoid air cells are clear.

Soft tissues: Nasal soft tissue swelling.

CT CERVICAL SPINE FINDINGS

Alignment: No traumatic malalignment. Facet mediated trace
anterolisthesis at C7-T1.

Skull base and vertebrae: No acute fracture. No primary bone lesion
or focal pathologic process.

Soft tissues and spinal canal: No prevertebral fluid or swelling. No
visible canal hematoma.

Disc levels: Moderate to severe disc height loss and right greater
than left uncovertebral hypertrophy from C3-C4 through C7-T1.

Upper chest: Negative.

Other: None.
IMPRESSION: CT head:

1. No acute intracranial abnormality. Small forehead scalp
laceration.
2. Moderate atrophy and mild chronic microvascular ischemic changes.

CT maxillofacial:

1. Bilateral nasal bone fractures with overlying soft tissue
swelling.

CT cervical spine:

1.  No acute cervical spine fracture.
2. Moderate to severe cervical spondylosis.
